# Patient Record
Sex: Female | Born: 1995 | Hispanic: Refuse to answer | Marital: Single | Smoking: Never smoker
Health system: Southern US, Community
[De-identification: ages and names within clinical notes are randomized; demographics above are authoritative.]

## PROBLEM LIST (undated history)

## (undated) DIAGNOSIS — N049 Nephrotic syndrome with unspecified morphologic changes: Secondary | ICD-10-CM

---
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
## 2016-11-03 ENCOUNTER — Ambulatory Visit (HOSPITAL_COMMUNITY)
Admission: EM | Admit: 2016-11-03 | Discharge: 2016-11-03 | Disposition: A | Discharge status: Home / Self Care | Payer: 59 | Attending: Internal Medicine | Admitting: Internal Medicine

## 2016-11-03 ENCOUNTER — Encounter (HOSPITAL_COMMUNITY): Payer: Self-pay | Admitting: *Deleted

## 2016-11-03 DIAGNOSIS — K529 Noninfective gastroenteritis and colitis, unspecified: Secondary | ICD-10-CM | POA: Diagnosis not present

## 2016-11-03 DIAGNOSIS — R197 Diarrhea, unspecified: Secondary | ICD-10-CM | POA: Diagnosis not present

## 2016-11-03 DIAGNOSIS — J00 Acute nasopharyngitis [common cold]: Secondary | ICD-10-CM | POA: Diagnosis not present

## 2016-11-03 DIAGNOSIS — R112 Nausea with vomiting, unspecified: Secondary | ICD-10-CM | POA: Diagnosis not present

## 2016-11-03 HISTORY — DX: Nephrotic syndrome with unspecified morphologic changes: N04.9

## 2016-11-03 MED ORDER — ONDANSETRON HCL 4 MG PO TABS: 4.0000 mg | ORAL_TABLET | Freq: Four times a day (QID) | ORAL | 0 refills | Status: DC

## 2016-11-03 MED ORDER — ONDANSETRON 4 MG PO TBDP
ORAL_TABLET | ORAL | Status: AC
  Filled 2016-11-03: qty 1

## 2016-11-03 MED ORDER — ONDANSETRON 4 MG PO TBDP
4.0000 mg | ORAL_TABLET | Freq: Once | ORAL | Status: AC
  Administered 2016-11-03: 4 mg via ORAL

## 2016-11-03 NOTE — Discharge Instructions (Signed)
You have some drainage going down the back of your throat causing discomfort and cough. Recommend taking Zyrtec or Allegra daily for that. For nausea and vomiting take the Zofran as directed. You have been given a dose of that in the urgent care prior to discharge. No solid foods for the next 24 hours. Drink primarily clear liquids in small frequent amounts. Slowly increase the volume as your stomach tolerates. Be sure to add Pedialyte to replace the electrolytes lost from vomiting and diarrhea. No milk or dairy products until all of your stomach problems have gone away. Tomorrow afternoon you can start with solid foods such as crackers, toast, bananas and rice  little bit at a time. Since your diarrhea has decreased do not take anything for it. It should stop on its own in the next 24-48 hours. Get plenty of rest and do not increase your activity over the next 3-4 days. He may take Tylenol for fever and discomfort but stay away from ibuprofen or Aleve.

## 2016-11-03 NOTE — ED Provider Notes (Signed)
CSN: [id number]     Arrival date & time 11/03/16  1407 History   First MD Initiated Contact with Patient 11/03/16 1658     Chief Complaint  Patient presents with  . Cough  . Emesis   (Consider location/radiation/quality/duration/timing/severity/associated sxs/prior Treatment) 21 year old female complaining of cough, body aches that have improved over the past day or 2, fevers that have been 102 at home, nausea, vomiting and diarrhea. Also with runny nose and PND. Denies sore throat or earache. Has taken NyQuil once for symptoms. Current temperature 100.7 pulse 101. Her mother is present.      Past Medical History:  Diagnosis Date  . Nephrotic syndrome    History reviewed. No pertinent surgical history. No family history on file. Social History  Substance Use Topics  . Smoking status: Never Smoker  . Smokeless tobacco: Never Used  . Alcohol use No   OB History    No data available     Review of Systems  Constitutional: Positive for activity change, appetite change and fever.  HENT: Positive for congestion, postnasal drip and rhinorrhea.   Respiratory: Positive for cough. Negative for shortness of breath.   Cardiovascular: Negative for chest pain.  Gastrointestinal: Positive for abdominal pain, diarrhea, nausea and vomiting.  Genitourinary: Negative.   Musculoskeletal: Negative.   Skin: Negative.   Neurological: Negative.   All other systems reviewed and are negative.   Allergies  Patient has no known allergies.  Home Medications   Prior to Admission medications   Medication Sig Start Date End Date Taking? Authorizing Provider  ondansetron (ZOFRAN) 4 MG tablet Take 1 tablet (4 mg total) by mouth every 6 (six) hours. 11/03/16   Hayden Rasmussen, NP   Meds Ordered and Administered this Visit   Medications  ondansetron (ZOFRAN-ODT) disintegrating tablet 4 mg (not administered)    BP 109/69   Pulse 101   Temp 100.7 F (38.2 C) (Oral)   Resp 20   LMP 10/20/2016  (Approximate)   SpO2 98%  No data found.   Physical Exam  Constitutional: She is oriented to person, place, and time. She appears well-developed and well-nourished. No distress.  HENT:  Head: Normocephalic and atraumatic.  Right Ear: External ear normal.  Left Ear: External ear normal.  Mouth/Throat: No oropharyngeal exudate.  Right TM normal. Left TM obscured by cerumen. Oropharynx with minor streaky erythema. No exudates or swelling.  Eyes: EOM are normal. Pupils are equal, round, and reactive to light.  Neck: Normal range of motion. Neck supple.  Cardiovascular: Normal rate, regular rhythm, normal heart sounds and intact distal pulses.   Pulmonary/Chest: Effort normal and breath sounds normal. No respiratory distress. She has no wheezes. She has no rales.  Abdominal: Soft. Bowel sounds are normal. She exhibits no distension. There is no tenderness. There is no rebound. No hernia.  Patient points to the bilateral most aspect of the abdomen as the source of discomfort. Currently no tenderness or pain. Abdomen is flat and nontender.  Musculoskeletal: Normal range of motion. She exhibits no edema.  Lymphadenopathy:    She has no cervical adenopathy.  Neurological: She is alert and oriented to person, place, and time.  Skin: Skin is warm and dry.  Psychiatric: She has a normal mood and affect.  Nursing note and vitals reviewed.   Urgent Care Course     Procedures (including critical care time)  Labs Review Labs Reviewed - No data to display  Imaging Review No results found.   Visual Acuity Review  Right Eye Distance:   Left Eye Distance:   Bilateral Distance:    Right Eye Near:   Left Eye Near:    Bilateral Near:         MDM   1. Acute nasopharyngitis   2. Gastroenteritis   3. Nausea vomiting and diarrhea    You have some drainage going down the back of your throat causing discomfort and cough. Recommend taking Zyrtec or Allegra daily for that. For nausea and  vomiting take the Zofran as directed. You have been given a dose of that in the urgent care prior to discharge. No solid foods for the next 24 hours. Drink primarily clear liquids in small frequent amounts. Slowly increase the volume as your stomach tolerates. Be sure to add Pedialyte to replace the electrolytes lost from vomiting and diarrhea. No milk or dairy products until all of your stomach problems have gone away. Tomorrow afternoon you can start with solid foods such as crackers, toast, bananas and rice  little bit at a time. Since your diarrhea has decreased do not take anything for it. It should stop on its own in the next 24-48 hours. Get plenty of rest and do not increase your activity over the next 3-4 days. He may take Tylenol for fever and discomfort but stay away from ibuprofen or Aleve. Meds ordered this encounter  Medications  . ondansetron (ZOFRAN) 4 MG tablet    Sig: Take 1 tablet (4 mg total) by mouth every 6 (six) hours.    Dispense:  12 tablet    Refill:  0    Order Specific Question:   Supervising Provider    Answer:   Eustace MooreMURRAY, LAURA W [[id number]]  . ondansetron (ZOFRAN-ODT) disintegrating tablet 4 mg       Hayden Rasmussenavid Evyn Kooyman, NP 11/03/16 1725

## 2016-11-03 NOTE — ED Triage Notes (Signed)
C/O starting with cough, body aches, nausea and body aches 3 days ago.  Started vomiting 2 days ago.  Able to keep down sips water and ginger ale, nothing else.  Fevers up to 102 today at home.  Took Nyquil - none today.

## 2017-01-03 ENCOUNTER — Encounter (HOSPITAL_COMMUNITY): Payer: Self-pay | Admitting: Emergency Medicine

## 2017-01-03 ENCOUNTER — Ambulatory Visit (HOSPITAL_COMMUNITY)
Admission: EM | Admit: 2017-01-03 | Discharge: 2017-01-03 | Disposition: A | Discharge status: Home / Self Care | Payer: 59 | Attending: Family Medicine | Admitting: Family Medicine

## 2017-01-03 DIAGNOSIS — R112 Nausea with vomiting, unspecified: Secondary | ICD-10-CM | POA: Diagnosis not present

## 2017-01-03 DIAGNOSIS — R197 Diarrhea, unspecified: Secondary | ICD-10-CM | POA: Diagnosis not present

## 2017-01-03 MED ORDER — ONDANSETRON HCL 4 MG PO TABS: 4.0000 mg | ORAL_TABLET | Freq: Four times a day (QID) | ORAL | 0 refills | Status: DC

## 2017-01-03 NOTE — ED Triage Notes (Signed)
Abdominal pain vomiting.  Reports symptoms started yesterday.  Patient had one episode of diarrhea.

## 2017-01-03 NOTE — ED Provider Notes (Signed)
[facility]    CSN: [id number] Arrival date & time: 01/03/17  1834     History   Chief Complaint Chief Complaint  Patient presents with  . Abdominal Pain    HPI Tami Robinson is a 21 y.o. female.   Abdominal pain vomiting.  Reports symptoms started yesterday.  Patient had one episode of diarrhea.    Patient works at [employer] in [place]. She has a roommate who is been in good health. Her symptoms began shortly after she had some food at work with vomiting and nausea. When she woke up this morning, she vomited some more and had some diarrhea. She's not had any vomiting since this morning and she's been able to keep fluids down.  She has no intense abdominal pain but rather has some queasiness in the midabdomen.      Past Medical History:  Diagnosis Date  . Nephrotic syndrome     There are no active problems to display for this patient.   History reviewed. No pertinent surgical history.  OB History    No data available       Home Medications    Prior to Admission medications   Medication Sig Start Date End Date Taking? Authorizing Provider  ondansetron (ZOFRAN) 4 MG tablet Take 1 tablet (4 mg total) by mouth every 6 (six) hours. 01/03/17   Elvina Sidle, MD    Family History No family history on file.  Social History Social History  Substance Use Topics  . Smoking status: Never Smoker  . Smokeless tobacco: Never Used  . Alcohol use No     Allergies   Patient has no known allergies.   Review of Systems Review of Systems  Constitutional: Positive for appetite change.  HENT: Negative.   Respiratory: Negative.   Gastrointestinal: Positive for abdominal pain, diarrhea and vomiting.  Genitourinary: Negative.   Neurological: Negative.      Physical Exam Triage Vital Signs ED Triage Vitals [01/03/17 1857]  Enc Vitals Group     BP 121/73     Pulse Rate 61     Resp 14     Temp 98.3 F (36.8 C)     Temp Source Oral   SpO2 100 %     Weight      Height      Head Circumference      Peak Flow      Pain Score 5     Pain Loc      Pain Edu?      Excl. in GC?    No data found.   Updated Vital Signs BP 121/73 (BP Location: Left Arm)   Pulse 61   Temp 98.3 F (36.8 C) (Oral)   Resp 14   SpO2 100%    Physical Exam  Constitutional: She is oriented to person, place, and time. She appears well-developed and well-nourished.  HENT:  Head: Normocephalic.  Right Ear: External ear normal.  Left Ear: External ear normal.  Mouth/Throat: Oropharynx is clear and moist.  Eyes: Conjunctivae are normal. Pupils are equal, round, and reactive to light.  Neck: Normal range of motion. Neck supple.  Cardiovascular: Normal rate, regular rhythm and normal heart sounds.   Pulmonary/Chest: Effort normal and breath sounds normal.  Abdominal: Soft. She exhibits no distension. There is no tenderness. There is no guarding.  Musculoskeletal: Normal range of motion.  Neurological: She is alert and oriented to person, place, and time.  Skin: Skin is warm and dry.  Nursing note and vitals reviewed.    UC Treatments / Results  Labs (all labs ordered are listed, but only abnormal results are displayed) Labs Reviewed - No data to display  EKG  EKG Interpretation None       Radiology No results found.  Procedures Procedures (including critical care time)  Medications Ordered in UC Medications - No data to display   Initial Impression / Assessment and Plan / UC Course  I have reviewed the triage vital signs and the nursing notes.  Pertinent labs & imaging results that were available during my care of the patient were reviewed by me and considered in my medical decision making (see chart for details).     Final Clinical Impressions(s) / UC Diagnoses   Final diagnoses:  Nausea vomiting and diarrhea    New Prescriptions Current Discharge Medication List       Elvina Sidle, MD 01/03/17 [phone]

## 2017-02-01 ENCOUNTER — Encounter (HOSPITAL_COMMUNITY): Payer: Self-pay | Admitting: Emergency Medicine

## 2017-02-01 ENCOUNTER — Ambulatory Visit (HOSPITAL_COMMUNITY)
Admission: EM | Admit: 2017-02-01 | Discharge: 2017-02-01 | Disposition: A | Discharge status: Home / Self Care | Payer: 59 | Attending: Internal Medicine | Admitting: Internal Medicine

## 2017-02-01 DIAGNOSIS — R197 Diarrhea, unspecified: Secondary | ICD-10-CM

## 2017-02-01 DIAGNOSIS — R109 Unspecified abdominal pain: Secondary | ICD-10-CM

## 2017-02-01 DIAGNOSIS — R112 Nausea with vomiting, unspecified: Secondary | ICD-10-CM | POA: Diagnosis not present

## 2017-02-01 DIAGNOSIS — R1115 Cyclical vomiting syndrome unrelated to migraine: Secondary | ICD-10-CM

## 2017-02-01 DIAGNOSIS — G43A0 Cyclical vomiting, not intractable: Secondary | ICD-10-CM | POA: Diagnosis not present

## 2017-02-01 MED ORDER — ONDANSETRON HCL 4 MG PO TABS: 4.0000 mg | ORAL_TABLET | Freq: Four times a day (QID) | ORAL | 0 refills | Status: DC

## 2017-02-01 NOTE — ED Provider Notes (Signed)
CSN: [id number]     Arrival date & time 02/01/17  1057 History   First MD Initiated Contact with Patient 02/01/17 1304     Chief Complaint  Patient presents with  . Abdominal Cramping   (Consider location/radiation/quality/duration/timing/severity/associated sxs/prior Treatment) 21 yo presents with a <48 history of nausea, some mild diarrhea and emesis. Actually improved today. She notes that she had seafood 3 days ago and does not know if related. She had some abdominal cramping, but this has subsided. She denies bloody diarrhea or fever. See remainder of ROS. She is requesting a work note.       Past Medical History:  Diagnosis Date  . Nephrotic syndrome    History reviewed. No pertinent surgical history. History reviewed. No pertinent family history. Social History  Substance Use Topics  . Smoking status: Never Smoker  . Smokeless tobacco: Never Used  . Alcohol use No   OB History    No data available     Review of Systems  Constitutional: Negative for chills and fever.  Gastrointestinal: Positive for diarrhea, nausea and vomiting. Negative for abdominal distention, abdominal pain, blood in stool and constipation.  Genitourinary: Negative.   Skin: Negative.     Allergies  Patient has no known allergies.  Home Medications   Prior to Admission medications   Medication Sig Start Date End Date Taking? Authorizing Provider  ondansetron (ZOFRAN) 4 MG tablet Take 1 tablet (4 mg total) by mouth every 6 (six) hours. 02/01/17   Riki SheerYoung, Peony Barner G, PA-C   Meds Ordered and Administered this Visit  Medications - No data to display  BP (!) 103/57 (BP Location: Right Arm)   Pulse 65   Temp 98.3 F (36.8 C) (Oral)   Resp 16   SpO2 99%  No data found.   Physical Exam  Constitutional: She is oriented to person, place, and time. She appears well-developed and well-nourished. No distress.  HENT:  Head: Normocephalic and atraumatic.  Abdominal: Soft. Bowel sounds are normal.  She exhibits no distension and no mass. There is no tenderness. There is no rebound and no guarding.  Neurological: She is alert and oriented to person, place, and time.  Skin: Skin is warm and dry. She is not diaphoretic.  Psychiatric: Her behavior is normal.  Nursing note and vitals reviewed.   Urgent Care Course     Procedures (including critical care time)  Labs Review Labs Reviewed - No data to display  Imaging Review No results found.   Visual Acuity Review  Right Eye Distance:   Left Eye Distance:   Bilateral Distance:    Right Eye Near:   Left Eye Near:    Bilateral Near:         MDM   1. Non-intractable cyclical vomiting with nausea    Improving with normal Abdominal exam today. Will give RX for Zofran if needed. Work note is given as requested. She is hopefully improving. Continue with bland diet and liquids.  Happy to see her back as needed.     Riki SheerYoung, Rinda Rollyson G, PA-C 02/01/17 1329

## 2017-02-01 NOTE — ED Triage Notes (Signed)
[facility] a [facility] of abdominal cramping with N/V after eating seafood 3 days ago.

## 2017-02-01 NOTE — Discharge Instructions (Signed)
Hopefully you are on the mend. May take zofran as needed for nausea. Be kind to your GI tract and eat really bland foods. No high fat. Rest and fluids. If worsens follow up. Feel better.

## 2017-05-31 ENCOUNTER — Ambulatory Visit (INDEPENDENT_AMBULATORY_CARE_PROVIDER_SITE_OTHER): Payer: 59 | Admitting: Urgent Care

## 2017-05-31 ENCOUNTER — Encounter: Payer: Self-pay | Admitting: Urgent Care

## 2017-05-31 VITALS — BP 92/51 | HR 71 | Temp 98.1°F | Resp 17 | Ht 62.5 in | Wt 106.0 lb

## 2017-05-31 DIAGNOSIS — R112 Nausea with vomiting, unspecified: Secondary | ICD-10-CM

## 2017-05-31 DIAGNOSIS — R1084 Generalized abdominal pain: Secondary | ICD-10-CM | POA: Diagnosis not present

## 2017-05-31 LAB — POCT URINALYSIS DIP (MANUAL ENTRY)
BILIRUBIN UA: NEGATIVE
Glucose, UA: NEGATIVE mg/dL
Ketones, POC UA: NEGATIVE mg/dL
Leukocytes, UA: NEGATIVE
NITRITE UA: NEGATIVE
PH UA: 6 (ref 5.0–8.0)
Protein Ur, POC: 30 mg/dL — AB
Spec Grav, UA: 1.025 (ref 1.010–1.025)
Urobilinogen, UA: 0.2 E.U./dL

## 2017-05-31 LAB — POCT URINE PREGNANCY: Preg Test, Ur: NEGATIVE

## 2017-05-31 MED ORDER — ONDANSETRON 8 MG PO TBDP: 8.0000 mg | ORAL_TABLET | Freq: Three times a day (TID) | ORAL | 0 refills | Status: DC | PRN

## 2017-05-31 NOTE — Progress Notes (Signed)
  MRN: [id number] DOB: 01/16/1996  Subjective:   Axel FillerDevyn M Boydstun is a 21 y.o. female presenting for chief complaint of Abdominal Pain and Emesis  Reports 1 day history of nausea with vomiting (2 episodes) that started today, had subsequent belly pain. She ate seafood last night. She was able to eat soup today. Has not tried any medications for relief. Denies fever, bloody stools, diarrhea, loose stools, hematemesis, hematuria, dysuria. Patient is currently on her menstrual cycle.   Denijah is not currently taking any medications. Also has No Known Allergies.  Maddilynn  has a past medical history of Nephrotic syndrome. Denies past surgical history.   Objective:   Vitals: BP (!) 92/51   Pulse 71   Temp 98.1 F (36.7 C) (Oral)   Resp 17   Ht 5' 2.5" (1.588 m)   Wt 106 lb (48.1 kg)   LMP 05/31/2017 (Approximate)   SpO2 98%   BMI 19.08 kg/m   Physical Exam  Constitutional: She is oriented to person, place, and time. She appears well-developed and well-nourished.  HENT:  Lips are dry, chapped.  Cardiovascular: Normal rate, regular rhythm and intact distal pulses.  Exam reveals no gallop and no friction rub.   No murmur heard. Pulmonary/Chest: No respiratory distress. She has no wheezes. She has no rales.  Abdominal: Soft. Bowel sounds are normal. She exhibits no distension and no mass. There is tenderness (diffuse, upper abdomen). There is no guarding.  Neurological: She is alert and oriented to person, place, and time.  Skin: Skin is warm and dry.   Results for orders placed or performed in visit on 05/31/17 (from the past 24 hour(s))  POCT urinalysis dipstick     Status: Abnormal   Collection Time: 05/31/17  1:55 PM  Result Value Ref Range   Color, UA yellow yellow   Clarity, UA clear clear   Glucose, UA negative negative mg/dL   Bilirubin, UA negative negative   Ketones, POC UA negative negative mg/dL   Spec Grav, UA [id number] 1.1911.010 - 1.025   Blood, UA trace-lysed (A) negative   pH, UA  6.0 5.0 - 8.0   Protein Ur, POC =30 (A) negative mg/dL   Urobilinogen, UA 0.2 0.2 or 1.0 E.U./dL   Nitrite, UA Negative Negative   Leukocytes, UA Negative Negative  POCT urine pregnancy     Status: None   Collection Time: 05/31/17  1:56 PM  Result Value Ref Range   Preg Test, Ur Negative Negative   Assessment and Plan :   1. Nausea and vomiting, intractability of vomiting not specified, unspecified vomiting type 2. Generalized abdominal pain - Will manage as a viral gastroenteritis. Hydrate aggressively, use Zofran for nausea with vomiting. Return-to-clinic precautions discussed, patient verbalized understanding.   Wallis BambergMario Sheilyn Boehlke, PA-C [facility] [phone] 05/31/2017  1:45 PM

## 2017-05-31 NOTE — Patient Instructions (Addendum)
Viral Gastroenteritis, Adult Viral gastroenteritis is also known as the stomach flu. This condition is caused by various viruses. These viruses can be passed from person to person very easily (are very contagious). This condition may affect your stomach, small intestine, and large intestine. It can cause sudden watery diarrhea, fever, and vomiting. Diarrhea and vomiting can make you feel weak and cause you to become dehydrated. You may not be able to keep fluids down. Dehydration can make you tired and thirsty, cause you to have a dry mouth, and decrease how often you urinate. Older adults and people with other diseases or a weak immune system are at higher risk for dehydration. It is important to replace the fluids that you lose from diarrhea and vomiting. If you become severely dehydrated, you may need to get fluids through an IV tube. What are the causes? Gastroenteritis is caused by various viruses, including rotavirus and norovirus. Norovirus is the most common cause in adults. You can get sick by eating food, drinking water, or touching a surface contaminated with one of these viruses. You can also get sick from sharing utensils or other personal items with an infected person. What increases the risk? This condition is more likely to develop in people:  Who have a weak defense system (immune system).  Who live with one or more children who are younger than 2 years old.  Who live in a nursing home.  Who go on cruise ships.  What are the signs or symptoms? Symptoms of this condition start suddenly 1-2 days after exposure to a virus. Symptoms may last a few days or as long as a week. The most common symptoms are watery diarrhea and vomiting. Other symptoms include:  Fever.  Headache.  Fatigue.  Pain in the abdomen.  Chills.  Weakness.  Nausea.  Muscle aches.  Loss of appetite.  How is this diagnosed? This condition is diagnosed with a medical history and physical exam. You  may also have a stool test to check for viruses or other infections. How is this treated? This condition typically goes away on its own. The focus of treatment is to restore lost fluids (rehydration). Your health care provider may recommend that you take an oral rehydration solution (ORS) to replace important salts and minerals (electrolytes) in your body. Severe cases of this condition may require giving fluids through an IV tube. Treatment may also include medicine to help with your symptoms. Follow these instructions at home: Follow instructions from your health care provider about how to care for yourself at home. Eating and drinking Follow these recommendations as told by your health care provider:  Take an ORS. This is a drink that is sold at pharmacies and retail stores.  Drink clear fluids in small amounts as you are able. Clear fluids include water, ice chips, diluted fruit juice, and low-calorie sports drinks.  Eat bland, easy-to-digest foods in small amounts as you are able. These foods include bananas, applesauce, rice, lean meats, toast, and crackers.  Avoid fluids that contain a lot of sugar or caffeine, such as energy drinks, sports drinks, and soda.  Avoid alcohol.  Avoid spicy or fatty foods.  General instructions   Drink enough fluid to keep your urine clear or pale yellow.  Wash your hands often. If soap and water are not available, use hand sanitizer.  Make sure that all people in your household wash their hands well and often.  Take over-the-counter and prescription medicines only as told by your health   care provider.  Rest at home while you recover.  Watch your condition for any changes.  Take a warm bath to relieve any burning or pain from frequent diarrhea episodes.  Keep all follow-up visits as told by your health care provider. This is important. Contact a health care provider if:  You cannot keep fluids down.  Your symptoms get worse.  You have  new symptoms.  You feel light-headed or dizzy.  You have muscle cramps. Get help right away if:  You have chest pain.  You feel extremely weak or you faint.  You see blood in your vomit.  Your vomit looks like coffee grounds.  You have bloody or black stools or stools that look like tar.  You have a severe headache, a stiff neck, or both.  You have a rash.  You have severe pain, cramping, or bloating in your abdomen.  You have trouble breathing or you are breathing very quickly.  Your heart is beating very quickly.  Your skin feels cold and clammy.  You feel confused.  You have pain when you urinate.  You have signs of dehydration, such as: ? Dark urine, very little urine, or no urine. ? Cracked lips. ? Dry mouth. ? Sunken eyes. ? Sleepiness. ? Weakness. This information is not intended to replace advice given to you by your health care provider. Make sure you discuss any questions you have with your health care provider. Document Released: 09/10/2005 Document Revised: 02/22/2016 Document Reviewed: 05/17/2015 Elsevier Interactive Patient Education  2017 [employer]     IF you received an x-ray today, you will receive an invoice from [facility]. Please contact [facility] at [phone] with questions or concerns regarding your invoice.   IF you received labwork today, you will receive an invoice from [facility]. Please contact [facility] at [phone] with questions or concerns regarding your invoice.   Our billing staff will not be able to assist you with questions regarding bills from these companies.  You will be contacted with the lab results as soon as they are available. The fastest way to get your results is to activate your My Chart account. Instructions are located on the last page of this paperwork. If you have not heard from us regarding the results in 2 weeks, please contact this office.

## 2017-06-10 ENCOUNTER — Ambulatory Visit (INDEPENDENT_AMBULATORY_CARE_PROVIDER_SITE_OTHER): Payer: 59 | Admitting: Physician Assistant

## 2017-06-10 ENCOUNTER — Encounter: Payer: Self-pay | Admitting: Physician Assistant

## 2017-06-10 VITALS — BP 107/74 | HR 76 | Temp 98.1°F | Resp 16 | Ht 62.5 in | Wt 103.0 lb

## 2017-06-10 DIAGNOSIS — Z113 Encounter for screening for infections with a predominantly sexual mode of transmission: Secondary | ICD-10-CM | POA: Diagnosis not present

## 2017-06-10 NOTE — Progress Notes (Signed)
PRIMARY CARE AT [facility] [address], [place] [address] [phone]  Date:  06/10/2017   Name:  TALULLAH ABATE   DOB:  1996/04/02   MRN:  [id number]  PCP:  Patient, No Pcp Per    History of Present Illness:  Jamilla MEG NIEMEIER is a 21 y.o. female patient who presents to PCP with  Chief Complaint  Patient presents with  . std check    no symptoms    She is with one partner   Concerned of partner that is having spotting.  She has had no symptoms.  No vaginal bleeding, abnormal discharge, dysuria,, hematuria, frequency, abdominal pian, or rash.  No feelings of malaise.    There are no active problems to display for this patient.   Past Medical History:  Diagnosis Date  . Nephrotic syndrome     No past surgical history on file.  Social History  Substance Use Topics  . Smoking status: Never Smoker  . Smokeless tobacco: Never Used  . Alcohol use No    No family history on file.  No Known Allergies  Medication list has been reviewed and updated.  Current Outpatient Prescriptions on File Prior to Visit  Medication Sig Dispense Refill  . ondansetron (ZOFRAN-ODT) 8 MG disintegrating tablet Take 1 tablet (8 mg total) by mouth every 8 (eight) hours as needed for nausea. (Patient not taking: Reported on 06/10/2017) 15 tablet 0   No current facility-administered medications on file prior to visit.     ROS ROS otherwise unremarkable unless listed above.  Physical Examination: BP 107/74   Pulse 76   Temp 98.1 F (36.7 C) (Oral)   Resp 16   Ht 5' 2.5" (1.588 m)   Wt 103 lb (46.7 kg)   LMP 05/31/2017 (Approximate)   SpO2 99%   BMI 18.54 kg/m  Ideal Body Weight: Weight in (lb) to have BMI = 25: 138.6  Physical Exam  Constitutional: She is oriented to person, place, and time. She appears well-developed and well-nourished. No distress.  HENT:  Head: Normocephalic and atraumatic.  Right Ear: External ear normal.  Left Ear: External ear normal.  Eyes: Pupils are  equal, round, and reactive to light. Conjunctivae and EOM are normal.  Cardiovascular: Normal rate.   Pulmonary/Chest: Effort normal. No respiratory distress.  Neurological: She is alert and oriented to person, place, and time.  Skin: She is not diaphoretic.  Psychiatric: She has a normal mood and affect. Her behavior is normal.     Assessment and Plan: Camri VAUGHN FRIEZE is a 21 y.o. female who is here today for today with  Chief Complaint  Patient presents with  . std check    no symptoms   Screening for STD (sexually transmitted disease) - Plan: HIV antibody, RPR, GC/Chlamydia Probe Amp  Trena Platt, PA-C [facility] 9/20/20188:34 AM

## 2017-06-10 NOTE — Patient Instructions (Signed)
     IF you received an x-ray today, you will receive an invoice from [facility]. Please contact  Radiology at [phone] with questions or concerns regarding your invoice.   IF you received labwork today, you will receive an invoice from [facility]. Please contact [facility] at [phone] with questions or concerns regarding your invoice.   Our billing staff will not be able to assist you with questions regarding bills from these companies.  You will be contacted with the lab results as soon as they are available. The fastest way to get your results is to activate your My Chart account. Instructions are located on the last page of this paperwork. If you have not heard from us regarding the results in 2 weeks, please contact this office.     

## 2017-06-11 LAB — GC/CHLAMYDIA PROBE AMP
Chlamydia trachomatis, NAA: NEGATIVE
Neisseria gonorrhoeae by PCR: NEGATIVE

## 2017-06-11 LAB — HIV ANTIBODY (ROUTINE TESTING W REFLEX): HIV SCREEN 4TH GENERATION: NONREACTIVE

## 2017-06-11 LAB — RPR: RPR: NONREACTIVE

## 2017-07-29 ENCOUNTER — Encounter: Payer: 59 | Admitting: Physician Assistant

## 2017-08-10 ENCOUNTER — Encounter: Payer: Self-pay | Admitting: Physician Assistant

## 2017-08-30 ENCOUNTER — Telehealth: Payer: Self-pay | Admitting: Physician Assistant

## 2017-08-30 NOTE — Telephone Encounter (Signed)
I tried to call pt. To reschedule her appt. From Monday 09/02/17 to 08/31/17 or a different day due to the upcoming snow.  If pt. calls back, please try to reschedule her for tomorrow 08/31/17 or a different day.   Thank you!

## 2017-09-02 ENCOUNTER — Encounter: Payer: 59 | Admitting: Physician Assistant

## 2017-09-06 ENCOUNTER — Encounter: Payer: 59 | Admitting: Urgent Care

## 2017-09-10 ENCOUNTER — Ambulatory Visit (INDEPENDENT_AMBULATORY_CARE_PROVIDER_SITE_OTHER): Payer: 59 | Admitting: Urgent Care

## 2017-09-10 ENCOUNTER — Encounter: Payer: Self-pay | Admitting: Urgent Care

## 2017-09-10 VITALS — BP 100/80 | HR 67 | Temp 98.9°F | Resp 16 | Ht 61.0 in | Wt 103.2 lb

## 2017-09-10 DIAGNOSIS — Z23 Encounter for immunization: Secondary | ICD-10-CM | POA: Diagnosis not present

## 2017-09-10 DIAGNOSIS — Z1159 Encounter for screening for other viral diseases: Secondary | ICD-10-CM | POA: Diagnosis not present

## 2017-09-10 DIAGNOSIS — Z113 Encounter for screening for infections with a predominantly sexual mode of transmission: Secondary | ICD-10-CM

## 2017-09-10 DIAGNOSIS — Z0000 Encounter for general adult medical examination without abnormal findings: Secondary | ICD-10-CM

## 2017-09-10 DIAGNOSIS — Z7251 High risk heterosexual behavior: Secondary | ICD-10-CM

## 2017-09-10 NOTE — Patient Instructions (Addendum)
Health Maintenance, Female Adopting a healthy lifestyle and getting preventive care can go a long way to promote health and wellness. Talk with your health care provider about what schedule of regular examinations is right for you. This is a good chance for you to check in with your provider about disease prevention and staying healthy. In between checkups, there are plenty of things you can do on your own. Experts have done a lot of research about which lifestyle changes and preventive measures are most likely to keep you healthy. Ask your health care provider for more information. Weight and diet Eat a healthy diet  Be sure to include plenty of vegetables, fruits, low-fat dairy products, and lean protein.  Do not eat a lot of foods high in solid fats, added sugars, or salt.  Get regular exercise. This is one of the most important things you can do for your health. ? Most adults should exercise for at least 150 minutes each week. The exercise should increase your heart rate and make you sweat (moderate-intensity exercise). ? Most adults should also do strengthening exercises at least twice a week. This is in addition to the moderate-intensity exercise.  Maintain a healthy weight  Body mass index (BMI) is a measurement that can be used to identify possible weight problems. It estimates body fat based on height and weight. Your health care provider can help determine your BMI and help you achieve or maintain a healthy weight.  For females 20 years of age and older: ? A BMI below 18.5 is considered underweight. ? A BMI of 18.5 to 24.9 is normal. ? A BMI of 25 to 29.9 is considered overweight. ? A BMI of 30 and above is considered obese.  Watch levels of cholesterol and blood lipids  You should start having your blood tested for lipids and cholesterol at 20 years of age, then have this test every 5 years.  You may need to have your cholesterol levels checked more often if: ? Your lipid or  cholesterol levels are high. ? You are older than 21 years of age. ? You are at high risk for heart disease.  Cancer screening Lung Cancer  Lung cancer screening is recommended for adults 55-80 years old who are at high risk for lung cancer because of a history of smoking.  A yearly low-dose CT scan of the lungs is recommended for people who: ? Currently smoke. ? Have quit within the past 15 years. ? Have at least a 30-pack-year history of smoking. A pack year is smoking an average of one pack of cigarettes a day for 1 year.  Yearly screening should continue until it has been 15 years since you quit.  Yearly screening should stop if you develop a health problem that would prevent you from having lung cancer treatment.  Breast Cancer  Practice breast self-awareness. This means understanding how your breasts normally appear and feel.  It also means doing regular breast self-exams. Let your health care provider know about any changes, no matter how small.  If you are in your 20s or 30s, you should have a clinical breast exam (CBE) by a health care provider every 1-3 years as part of a regular health exam.  If you are 40 or older, have a CBE every year. Also consider having a breast X-ray (mammogram) every year.  If you have a family history of breast cancer, talk to your health care provider about genetic screening.  If you are at high risk   for breast cancer, talk to your health care provider about having an MRI and a mammogram every year.  Breast cancer gene (BRCA) assessment is recommended for women who have family members with BRCA-related cancers. BRCA-related cancers include: ? Breast. ? Ovarian. ? Tubal. ? Peritoneal cancers.  Results of the assessment will determine the need for genetic counseling and BRCA1 and BRCA2 testing.  Cervical Cancer Your health care provider may recommend that you be screened regularly for cancer of the pelvic organs (ovaries, uterus, and  vagina). This screening involves a pelvic examination, including checking for microscopic changes to the surface of your cervix (Pap test). You may be encouraged to have this screening done every 3 years, beginning at age 22.  For women ages 56-65, health care providers may recommend pelvic exams and Pap testing every 3 years, or they may recommend the Pap and pelvic exam, combined with testing for human papilloma virus (HPV), every 5 years. Some types of HPV increase your risk of cervical cancer. Testing for HPV may also be done on women of any age with unclear Pap test results.  Other health care providers may not recommend any screening for nonpregnant women who are considered low risk for pelvic cancer and who do not have symptoms. Ask your health care provider if a screening pelvic exam is right for you.  If you have had past treatment for cervical cancer or a condition that could lead to cancer, you need Pap tests and screening for cancer for at least 20 years after your treatment. If Pap tests have been discontinued, your risk factors (such as having a new sexual partner) need to be reassessed to determine if screening should resume. Some women have medical problems that increase the chance of getting cervical cancer. In these cases, your health care provider may recommend more frequent screening and Pap tests.  Colorectal Cancer  This type of cancer can be detected and often prevented.  Routine colorectal cancer screening usually begins at 21 years of age and continues through 21 years of age.  Your health care provider may recommend screening at an earlier age if you have risk factors for colon cancer.  Your health care provider may also recommend using home test kits to check for hidden blood in the stool.  A small camera at the end of a tube can be used to examine your colon directly (sigmoidoscopy or colonoscopy). This is done to check for the earliest forms of colorectal  cancer.  Routine screening usually begins at age 33.  Direct examination of the colon should be repeated every 5-10 years through 21 years of age. However, you may need to be screened more often if early forms of precancerous polyps or small growths are found.  Skin Cancer  Check your skin from head to toe regularly.  Tell your health care provider about any new moles or changes in moles, especially if there is a change in a mole's shape or color.  Also tell your health care provider if you have a mole that is larger than the size of a pencil eraser.  Always use sunscreen. Apply sunscreen liberally and repeatedly throughout the day.  Protect yourself by wearing long sleeves, pants, a wide-brimmed hat, and sunglasses whenever you are outside.  Heart disease, diabetes, and high blood pressure  High blood pressure causes heart disease and increases the risk of stroke. High blood pressure is more likely to develop in: ? People who have blood pressure in the high end of  the normal range (130-139/85-89 mm Hg). ? People who are overweight or obese. ? People who are African American.  If you are 21-29 years of age, have your blood pressure checked every 3-5 years. If you are 3 years of age or older, have your blood pressure checked every year. You should have your blood pressure measured twice-once when you are at a hospital or clinic, and once when you are not at a hospital or clinic. Record the average of the two measurements. To check your blood pressure when you are not at a hospital or clinic, you can use: ? An automated blood pressure machine at a pharmacy. ? A home blood pressure monitor.  If you are between 17 years and 37 years old, ask your health care provider if you should take aspirin to prevent strokes.  Have regular diabetes screenings. This involves taking a blood sample to check your fasting blood sugar level. ? If you are at a normal weight and have a low risk for diabetes,  have this test once every three years after 21 years of age. ? If you are overweight and have a high risk for diabetes, consider being tested at a younger age or more often. Preventing infection Hepatitis B  If you have a higher risk for hepatitis B, you should be screened for this virus. You are considered at high risk for hepatitis B if: ? You were born in a country where hepatitis B is common. Ask your health care provider which countries are considered high risk. ? Your parents were born in a high-risk country, and you have not been immunized against hepatitis B (hepatitis B vaccine). ? You have HIV or AIDS. ? You use needles to inject street drugs. ? You live with someone who has hepatitis B. ? You have had sex with someone who has hepatitis B. ? You get hemodialysis treatment. ? You take certain medicines for conditions, including cancer, organ transplantation, and autoimmune conditions.  Hepatitis C  Blood testing is recommended for: ? Everyone born from [birth year] through 1965. ? Anyone with known risk factors for hepatitis C.  Sexually transmitted infections (STIs)  You should be screened for sexually transmitted infections (STIs) including gonorrhea and chlamydia if: ? You are sexually active and are younger than 21 years of age. ? You are older than 21 years of age and your health care provider tells you that you are at risk for this type of infection. ? Your sexual activity has changed since you were last screened and you are at an increased risk for chlamydia or gonorrhea. Ask your health care provider if you are at risk.  If you do not have HIV, but are at risk, it may be recommended that you take a prescription medicine daily to prevent HIV infection. This is called pre-exposure prophylaxis (PrEP). You are considered at risk if: ? You are sexually active and do not regularly use condoms or know the HIV status of your partner(s). ? You take drugs by injection. ? You are  sexually active with a partner who has HIV.  Talk with your health care provider about whether you are at high risk of being infected with HIV. If you choose to begin PrEP, you should first be tested for HIV. You should then be tested every 3 months for as long as you are taking PrEP. Pregnancy  If you are premenopausal and you may become pregnant, ask your health care provider about preconception counseling.  If you may become  pregnant, take 400 to 800 micrograms (mcg) of folic acid every day.  If you want to prevent pregnancy, talk to your health care provider about birth control (contraception). Osteoporosis and menopause  Osteoporosis is a disease in which the bones lose minerals and strength with aging. This can result in serious bone fractures. Your risk for osteoporosis can be identified using a bone density scan.  If you are 37 years of age or older, or if you are at risk for osteoporosis and fractures, ask your health care provider if you should be screened.  Ask your health care provider whether you should take a calcium or vitamin D supplement to lower your risk for osteoporosis.  Menopause may have certain physical symptoms and risks.  Hormone replacement therapy may reduce some of these symptoms and risks. Talk to your health care provider about whether hormone replacement therapy is right for you. Follow these instructions at home:  Schedule regular health, dental, and eye exams.  Stay current with your immunizations.  Do not use any tobacco products including cigarettes, chewing tobacco, or electronic cigarettes.  If you are pregnant, do not drink alcohol.  If you are breastfeeding, limit how much and how often you drink alcohol.  Limit alcohol intake to no more than 1 drink per day for nonpregnant women. One drink equals 12 ounces of beer, 5 ounces of wine, or 1 ounces of hard liquor.  Do not use street drugs.  Do not share needles.  Ask your health care  provider for help if you need support or information about quitting drugs.  Tell your health care provider if you often feel depressed.  Tell your health care provider if you have ever been abused or do not feel safe at home. This information is not intended to replace advice given to you by your health care provider. Make sure you discuss any questions you have with your health care provider. Document Released: 03/26/2011 Document Revised: 02/16/2016 Document Reviewed: 06/14/2015 Elsevier Interactive Patient Education  2018 [place] Sex Practicing safe sex means taking steps before and during sex to reduce your risk of:  Getting an STD (sexually transmitted disease).  Giving your partner an STD.  Unwanted pregnancy.  How can I practice safe sex?  To practice safe sex:  Limit your sexual partners to only one partner who is having sex with only you.  Avoid using alcohol and recreational drugs before having sex. These substances can affect your judgment.  Before having sex with a new partner: ? Talk to your partner about past partners, past STDs, and drug use. ? You and your partner should be screened for STDs and discuss the results with each other.  Check your body regularly for sores, blisters, rashes, or unusual discharge. If you notice any of these problems, visit your health care provider.  If you have symptoms of an infection or you are being treated for an STD, avoid sexual contact.  While having sex, use a condom. Make sure to: ? Use a condom every time you have vaginal, oral, or anal sex. Both females and males should wear condoms during oral sex. ? Keep condoms in place from the beginning to the end of sexual activity. ? Use a latex condom, if possible. Latex condoms offer the best protection. ? Use only water-based lubricants or oils to lubricate a condom. Using petroleum-based lubricants or oils will weaken the condom and increase the chance that it  will break.  See your  health care provider for regular screenings, exams, and tests for STDs.  Talk with your health care provider about the form of birth control (contraception) that is best for you.  Get vaccinated against hepatitis B and human papillomavirus (HPV).  If you are at risk of being infected with HIV (human immunodeficiency virus), talk with your health care provider about taking a prescription medicine to prevent HIV infection. You are considered at risk for HIV if: ? You are a man who has sex with other men. ? You are a heterosexual man or woman who is sexually active with more than one partner. ? You take drugs by injection. ? You are sexually active with a partner who has HIV.  This information is not intended to replace advice given to you by your health care provider. Make sure you discuss any questions you have with your health care provider. Document Released: 10/18/2004 Document Revised: 01/25/2016 Document Reviewed: 07/31/2015 Elsevier Interactive Patient Education  2018 [employer].   IF you received an x-ray today, you will receive an invoice from [facility]. Please contact [facility] at [phone] with questions or concerns regarding your invoice.   IF you received labwork today, you will receive an invoice from [place]. Please contact [facility] at [phone] with questions or concerns regarding your invoice.   Our billing staff will not be able to assist you with questions regarding bills from these companies.  You will be contacted with the lab results as soon as they are available. The fastest way to get your results is to activate your My Chart account. Instructions are located on the last page of this paperwork. If you have not heard from [place] regarding the results in 2 weeks, please contact this office.

## 2017-09-10 NOTE — Progress Notes (Signed)
  MRN: [id number]  Subjective:   Ms. Tami Robinson is a 21 y.o. female presenting for annual physical exam. Is currently single, has sex with women only, does not use any protection. Works in [employer], Producer, television/film/videomanual labor, very fast paced job. Tries to hydrate but admits she should be drinking more water. Denies smoking cigarettes, has 1 glass of wine per week.   Medical care team includes: PCP: Patient, No Pcp Per Vision: She is supposed to wear glasses.  Dental: Cleanings once every six months. OB/GYN: Has never had a pap smear. Patient prefers to return to clinic for this. Specialists: None.   Tami Robinson currently has no medications in their medication list. She has No Known Allergies. Tami Robinson  has a past medical history of Nephrotic syndrome. Denies past surgical history. Denies family history of cancer, diabetes, HTN, HL, heart disease, stroke, mental illness.   Immunizations: Will update tdap. Refuses flu shot.  ROS  Objective:   Vitals: BP 100/80   Pulse 67   Temp 98.9 F (37.2 C) (Oral)   Resp 16   Ht 5\' 1"  (1.549 m)   Wt 103 lb 3.2 oz (46.8 kg)   SpO2 99%   BMI 19.50 kg/m   Visual acuity L: 20/30 R: 20/30 B: 20/30-1   Physical Exam  Constitutional: She is oriented to person, place, and time. She appears well-developed and well-nourished.  HENT:  TM's intact bilaterally, no effusions or erythema. Nasal turbinates pink and moist, nasal passages patent. No sinus tenderness. Oropharynx clear, mucous membranes moist, dentition in good repair.  Eyes: Conjunctivae and EOM are normal. Pupils are equal, round, and reactive to light. Right eye exhibits no discharge. Left eye exhibits no discharge. No scleral icterus.  Neck: Normal range of motion. Neck supple. No thyromegaly present.  Cardiovascular: Normal rate, regular rhythm and intact distal pulses. Exam reveals no gallop and no friction rub.  No murmur heard. Pulmonary/Chest: No respiratory distress. She has no wheezes. She  has no rales.  Abdominal: Soft. Bowel sounds are normal. She exhibits no distension and no mass. There is no tenderness.  Musculoskeletal: Normal range of motion. She exhibits no edema or tenderness.  Lymphadenopathy:    She has no cervical adenopathy.  Neurological: She is alert and oriented to person, place, and time. She has normal reflexes.  Skin: Skin is warm and dry. No rash noted. No erythema. No pallor.  Psychiatric: She has a normal mood and affect.   Assessment and Plan :   1. Annual physical exam - Medically stable, labs pending. Discussed healthy lifestyle, diet, exercise, preventative care, vaccinations, and addressed patient's concerns. Patient will rtc for a pap smear, she did not want this done today.  2.Encounter for hepatitis C screening test for low risk patient - Hepatitis C antibody  3. Need for Tdap vaccination - Tdap will be updated today.  4. Routine screening for STI (sexually transmitted infection) 5. Unprotected sex - Labs pending, counseled on safe sex practices. - GC/Chlamydia Probe Amp - HIV antibody - RPR - Trichomonas vaginalis, RNA   Tami BambergMario Catrina Fellenz, PA-C [facility] [phone] 09/10/2017  5:33 PM

## 2017-09-11 LAB — RPR: RPR: NONREACTIVE

## 2017-09-11 LAB — CBC
HEMATOCRIT: 37 % (ref 34.0–46.6)
Hemoglobin: 12.7 g/dL (ref 11.1–15.9)
MCH: 31.2 pg (ref 26.6–33.0)
MCHC: 34.3 g/dL (ref 31.5–35.7)
MCV: 91 fL (ref 79–97)
PLATELETS: 243 10*3/uL (ref 150–379)
RBC: 4.07 x10E6/uL (ref 3.77–5.28)
RDW: 12.9 % (ref 12.3–15.4)
WBC: 5.9 10*3/uL (ref 3.4–10.8)

## 2017-09-11 LAB — COMPREHENSIVE METABOLIC PANEL
A/G RATIO: 2 (ref 1.2–2.2)
ALT: 12 IU/L (ref 0–32)
AST: 17 IU/L (ref 0–40)
Albumin: 4.5 g/dL (ref 3.5–5.5)
Alkaline Phosphatase: 66 IU/L (ref 39–117)
BILIRUBIN TOTAL: 0.3 mg/dL (ref 0.0–1.2)
BUN/Creatinine Ratio: 16 (ref 9–23)
BUN: 11 mg/dL (ref 6–20)
CALCIUM: 9.4 mg/dL (ref 8.7–10.2)
CHLORIDE: 104 mmol/L (ref 96–106)
CO2: 24 mmol/L (ref 20–29)
Creatinine, Ser: 0.7 mg/dL (ref 0.57–1.00)
GFR calc Af Amer: 143 mL/min/{1.73_m2} (ref >59)
GFR calc non Af Amer: 124 mL/min/{1.73_m2} (ref >59)
GLOBULIN, TOTAL: 2.2 g/dL (ref 1.5–4.5)
Glucose: 83 mg/dL (ref 65–99)
POTASSIUM: 4 mmol/L (ref 3.5–5.2)
SODIUM: 139 mmol/L (ref 134–144)
Total Protein: 6.7 g/dL (ref 6.0–8.5)

## 2017-09-11 LAB — TSH: TSH: 0.862 u[IU]/mL (ref 0.450–4.500)

## 2017-09-11 LAB — HEPATITIS C ANTIBODY: Hep C Virus Ab: <0.1 s/co ratio (ref 0.0–0.9)

## 2017-09-11 LAB — HIV ANTIBODY (ROUTINE TESTING W REFLEX): HIV Screen 4th Generation wRfx: NONREACTIVE

## 2017-09-11 LAB — TRICHOMONAS VAGINALIS, PROBE AMP: TRICH VAG BY NAA: NEGATIVE

## 2017-09-12 LAB — GC/CHLAMYDIA PROBE AMP
Chlamydia trachomatis, NAA: NEGATIVE
Neisseria gonorrhoeae by PCR: NEGATIVE

## 2017-10-15 ENCOUNTER — Ambulatory Visit (INDEPENDENT_AMBULATORY_CARE_PROVIDER_SITE_OTHER): Payer: 59 | Admitting: Physician Assistant

## 2017-10-15 ENCOUNTER — Encounter: Payer: Self-pay | Admitting: Physician Assistant

## 2017-10-15 VITALS — BP 102/80 | HR 67 | Temp 98.9°F | Resp 16 | Ht 61.0 in | Wt 103.0 lb

## 2017-10-15 DIAGNOSIS — Z124 Encounter for screening for malignant neoplasm of cervix: Secondary | ICD-10-CM | POA: Diagnosis not present

## 2017-10-15 DIAGNOSIS — Z01419 Encounter for gynecological examination (general) (routine) without abnormal findings: Secondary | ICD-10-CM | POA: Diagnosis not present

## 2017-10-15 NOTE — Progress Notes (Signed)
   Tami FillerDevyn M Stmarie  MRN: [id number] DOB: 09/01/1996  PCP: Patient, No Pcp Per  Subjective:  Pt is a 22 year old female who presents to clinic for PAP. She had her annual exam 09/10/2018 but wanted to return for PAP. Sexually active with women only. Never had a PAP before. Denies symptoms. Tested negative for GC/chlamydia, RPR, HIV, hep C last month.   Review of Systems  Constitutional: Negative for chills, diaphoresis, fatigue and unexpected weight change.  Gastrointestinal: Negative for abdominal pain.  Genitourinary: Negative for menstrual problem, pelvic pain, vaginal bleeding, vaginal discharge and vaginal pain.    There are no active problems to display for this patient.   No current outpatient medications on file prior to visit.   No current facility-administered medications on file prior to visit.     No Known Allergies   Objective:  BP 102/80 (BP Location: Right Arm, Patient Position: Sitting, Cuff Size: Small)   Pulse 67   Temp 98.9 F (37.2 C) (Oral)   Resp 16   Ht 5\' 1"  (1.549 m)   Wt 103 lb (46.7 kg)   SpO2 99%   BMI 19.46 kg/m   Physical Exam  Constitutional: She is oriented to person, place, and time and well-developed, well-nourished, and in no distress. No distress.  Genitourinary: Vagina normal, uterus normal, cervix normal, right adnexa normal, left adnexa normal and vulva normal.  Neurological: She is alert and oriented to person, place, and time. GCS score is 15.  Skin: Skin is warm and dry.  Psychiatric: Mood, memory, affect and judgment normal.  Vitals reviewed.   Assessment and Plan :  1. Screening for cervical cancer 2. Encounter for gynecological examination without abnormal finding - Pap IG w/ reflex to HPV when [facility] - Pt presents for PAP. Annual exam done 09/10/2018 but she wanted to return for PAP. Sexually active with women only. Never had a PAP before. Asymptomatic. Tested negative for GC/chlamydia, RPR, HIV, hep C last month. Routine  health maintenance provided.   Marco CollieWhitney Marchelle Rinella, PA-C  [facility] at [facility] 10/15/2017 1:58 PM

## 2017-10-15 NOTE — Patient Instructions (Addendum)
For breast health go to [email or web address]  GUIDE TO HAPPY AND HEALTHY LIVING These are some of my general health and wellness recommendations. Some of them may apply to you better than others. Please use common sense as you try these suggestions and feel free to ask me any questions.  ACTIVITY/FITNESS Mental, social, emotional and physical stimulation are very important for brain and body health. Try learning a new activity (arts, music, language, sports, games).  Keep moving your body to the best of your abilities. You can do this at home, inside or outside, the park, community center, gym or anywhere you like. Consider a physical therapist or personal trainer to get started. Consider the app Sworkit. Fitness trackers such as smart-watches, smart-phones or Fitbits can help as well.   NUTRITION Eat more plants: colorful vegetables, nuts, seeds and berries. Eat less sugar, salt, preservatives and processed foods. Avoid toxins such as cigarettes and alcohol. Drink water when you are thirsty. Warm water with a slice of lemon is an excellent morning drink to start the day. Consider these websites for more information: The Nutrition Source (https://www.henry-hernandez.biz/) Precision Nutrition (WindowBlog.ch)   RELAXATION Consider practicing mindfulness meditation or other relaxation techniques such as deep breathing, prayer, yoga, tai chi, massage. See website mindful.org or the apps Headspace or Calm to help get started.   SLEEP Try to get at least 7-8+ hours sleep per day. Regular exercise and reduced caffeine will help you sleep better. Practice good sleep hygeine techniques. See website sleep.org for more information.   Come back and see me if you need anything.  Thank you for coming in today. I hope you feel we met your needs.  Feel free to call PCP if you have any questions or further requests.  Please consider signing up for  MyChart if you do not already have it, as this is a great way to communicate with me.  Best,  Whitney McVey, PA-C  Health Maintenance, Female Adopting a healthy lifestyle and getting preventive care can go a long way to promote health and wellness. Talk with your health care provider about what schedule of regular examinations is right for you. This is a good chance for you to check in with your provider about disease prevention and staying healthy. In between checkups, there are plenty of things you can do on your own. Experts have done a lot of research about which lifestyle changes and preventive measures are most likely to keep you healthy. Ask your health care provider for more information. Weight and diet Eat a healthy diet  Be sure to include plenty of vegetables, fruits, low-fat dairy products, and lean protein.  Do not eat a lot of foods high in solid fats, added sugars, or salt.  Get regular exercise. This is one of the most important things you can do for your health. ? Most adults should exercise for at least 150 minutes each week. The exercise should increase your heart rate and make you sweat (moderate-intensity exercise). ? Most adults should also do strengthening exercises at least twice a week. This is in addition to the moderate-intensity exercise.  Maintain a healthy weight  Body mass index (BMI) is a measurement that can be used to identify possible weight problems. It estimates body fat based on height and weight. Your health care provider can help determine your BMI and help you achieve or maintain a healthy weight.  For females 51 years of age and older: ? A BMI below 18.5 is considered underweight. ?  A BMI of 18.5 to 24.9 is normal. ? A BMI of 25 to 29.9 is considered overweight. ? A BMI of 30 and above is considered obese.  Watch levels of cholesterol and blood lipids  You should start having your blood tested for lipids and cholesterol at 22 years of age, then  have this test every 5 years.  You may need to have your cholesterol levels checked more often if: ? Your lipid or cholesterol levels are high. ? You are older than 22 years of age. ? You are at high risk for heart disease.  Cancer screening  Breast Cancer  Practice breast self-awareness. This means understanding how your breasts normally appear and feel.  It also means doing regular breast self-exams. Let your health care provider know about any changes, no matter how small.  If you are in your 20s or 30s, you should have a clinical breast exam (CBE) by a health care provider every 1-3 years as part of a regular health exam.  If you are 6 or older, have a CBE every year. Also consider having a breast X-ray (mammogram) every year.  If you have a family history of breast cancer, talk to your health care provider about genetic screening.  If you are at high risk for breast cancer, talk to your health care provider about having an MRI and a mammogram every year.  Breast cancer gene (BRCA) assessment is recommended for women who have family members with BRCA-related cancers. BRCA-related cancers include: ? Breast. ? Ovarian. ? Tubal. ? Peritoneal cancers.  Results of the assessment will determine the need for genetic counseling and BRCA1 and BRCA2 testing.  Cervical Cancer Your health care provider may recommend that you be screened regularly for cancer of the pelvic organs (ovaries, uterus, and vagina). This screening involves a pelvic examination, including checking for microscopic changes to the surface of your cervix (Pap test). You may be encouraged to have this screening done every 3 years, beginning at age 5.  For women ages 7-65, health care providers may recommend pelvic exams and Pap testing every 3 years, or they may recommend the Pap and pelvic exam, combined with testing for human papilloma virus (HPV), every 5 years. Some types of HPV increase your risk of cervical  cancer. Testing for HPV may also be done on women of any age with unclear Pap test results.  Other health care providers may not recommend any screening for nonpregnant women who are considered low risk for pelvic cancer and who do not have symptoms. Ask your health care provider if a screening pelvic exam is right for you.  If you have had past treatment for cervical cancer or a condition that could lead to cancer, you need Pap tests and screening for cancer for at least 20 years after your treatment. If Pap tests have been discontinued, your risk factors (such as having a new sexual partner) need to be reassessed to determine if screening should resume. Some women have medical problems that increase the chance of getting cervical cancer. In these cases, your health care provider may recommend more frequent screening and Pap tests.  Skin Cancer  Check your skin from head to toe regularly.  Tell your health care provider about any new moles or changes in moles, especially if there is a change in a mole's shape or color.  Also tell your health care provider if you have a mole that is larger than the size of a pencil eraser.  Always  use sunscreen. Apply sunscreen liberally and repeatedly throughout the day.  Protect yourself by wearing long sleeves, pants, a wide-brimmed hat, and sunglasses whenever you are outside.  Heart disease, diabetes, and high blood pressure  High blood pressure causes heart disease and increases the risk of stroke. High blood pressure is more likely to develop in: ? People who have blood pressure in the high end of the normal range (130-139/85-89 mm Hg). ? People who are overweight or obese. ? People who are African American.  If you are 75-66 years of age, have your blood pressure checked every 3-5 years. If you are 50 years of age or older, have your blood pressure checked every year. You should have your blood pressure measured twice-once when you are at a hospital  or clinic, and once when you are not at a hospital or clinic. Record the average of the two measurements. To check your blood pressure when you are not at a hospital or clinic, you can use: ? An automated blood pressure machine at a pharmacy. ? A home blood pressure monitor.  If you are between 75 years and 54 years old, ask your health care provider if you should take aspirin to prevent strokes.  Have regular diabetes screenings. This involves taking a blood sample to check your fasting blood sugar level. ? If you are at a normal weight and have a low risk for diabetes, have this test once every three years after 22 years of age. ? If you are overweight and have a high risk for diabetes, consider being tested at a younger age or more often. Preventing infection Hepatitis B  If you have a higher risk for hepatitis B, you should be screened for this virus. You are considered at high risk for hepatitis B if: ? You were born in a country where hepatitis B is common. Ask your health care provider which countries are considered high risk. ? Your parents were born in a high-risk country, and you have not been immunized against hepatitis B (hepatitis B vaccine). ? You have HIV or AIDS. ? You use needles to inject street drugs. ? You live with someone who has hepatitis B. ? You have had sex with someone who has hepatitis B. ? You get hemodialysis treatment. ? You take certain medicines for conditions, including cancer, organ transplantation, and autoimmune conditions.  Hepatitis C  Blood testing is recommended for: ? Everyone born from [birth year] through 1965. ? Anyone with known risk factors for hepatitis C.  Sexually transmitted infections (STIs)  You should be screened for sexually transmitted infections (STIs) including gonorrhea and chlamydia if: ? You are sexually active and are younger than 22 years of age. ? You are older than 22 years of age and your health care provider tells you that you  are at risk for this type of infection. ? Your sexual activity has changed since you were last screened and you are at an increased risk for chlamydia or gonorrhea. Ask your health care provider if you are at risk.  If you do not have HIV, but are at risk, it may be recommended that you take a prescription medicine daily to prevent HIV infection. This is called pre-exposure prophylaxis (PrEP). You are considered at risk if: ? You are sexually active and do not regularly use condoms or know the HIV status of your partner(s). ? You take drugs by injection. ? You are sexually active with a partner who has HIV.  Talk with your  health care provider about whether you are at high risk of being infected with HIV. If you choose to begin PrEP, you should first be tested for HIV. You should then be tested every 3 months for as long as you are taking PrEP. Pregnancy  If you are premenopausal and you may become pregnant, ask your health care provider about preconception counseling.  If you may become pregnant, take 400 to 800 micrograms (mcg) of folic acid every day.  If you want to prevent pregnancy, talk to your health care provider about birth control (contraception). Osteoporosis and menopause  Osteoporosis is a disease in which the bones lose minerals and strength with aging. This can result in serious bone fractures. Your risk for osteoporosis can be identified using a bone density scan.  If you are 36 years of age or older, or if you are at risk for osteoporosis and fractures, ask your health care provider if you should be screened.  Ask your health care provider whether you should take a calcium or vitamin D supplement to lower your risk for osteoporosis.  Menopause may have certain physical symptoms and risks.  Hormone replacement therapy may reduce some of these symptoms and risks. Talk to your health care provider about whether hormone replacement therapy is right for you. Follow these  instructions at home:  Schedule regular health, dental, and eye exams.  Stay current with your immunizations.  Do not use any tobacco products including cigarettes, chewing tobacco, or electronic cigarettes.  If you are pregnant, do not drink alcohol.  If you are breastfeeding, limit how much and how often you drink alcohol.  Limit alcohol intake to no more than 1 drink per day for nonpregnant women. One drink equals 12 ounces of beer, 5 ounces of wine, or 1 ounces of hard liquor.  Do not use street drugs.  Do not share needles.  Ask your health care provider for help if you need support or information about quitting drugs.  Tell your health care provider if you often feel depressed.  Tell your health care provider if you have ever been abused or do not feel safe at home. This information is not intended to replace advice given to you by your health care provider. Make sure you discuss any questions you have with your health care provider. Document Released: 03/26/2011 Document Revised: 02/16/2016 Document Reviewed: 06/14/2015 Elsevier Interactive Patient Education  2018 [employer].   IF you received an x-ray today, you will receive an invoice from [facility]. Please contact [facility] at [phone] with questions or concerns regarding your invoice.   IF you received labwork today, you will receive an invoice from [facility]. Please contact [facility] at [phone] with questions or concerns regarding your invoice.   Our billing staff will not be able to assist you with questions regarding bills from these companies.  You will be contacted with the lab results as soon as they are available. The fastest way to get your results is to activate your My Chart account. Instructions are located on the last page of this paperwork. If you have not heard from [place] regarding the results in 2 weeks, please contact this office.

## 2017-10-16 LAB — PAP IG W/ RFLX HPV ASCU: PAP Smear Comment: 0

## 2018-01-01 ENCOUNTER — Encounter: Payer: Self-pay | Admitting: Physician Assistant

## 2018-09-15 ENCOUNTER — Ambulatory Visit (INDEPENDENT_AMBULATORY_CARE_PROVIDER_SITE_OTHER): Payer: 59 | Admitting: Family Medicine

## 2018-09-15 ENCOUNTER — Other Ambulatory Visit: Payer: Self-pay

## 2018-09-15 ENCOUNTER — Encounter: Payer: Self-pay | Admitting: Family Medicine

## 2018-09-15 VITALS — BP 105/68 | HR 63 | Temp 98.8°F | Ht 61.0 in | Wt 103.6 lb

## 2018-09-15 DIAGNOSIS — N898 Other specified noninflammatory disorders of vagina: Secondary | ICD-10-CM | POA: Diagnosis not present

## 2018-09-15 LAB — POCT WET + KOH PREP
Trich by wet prep: ABSENT
Yeast by KOH: ABSENT
Yeast by wet prep: ABSENT

## 2018-09-15 LAB — POCT URINALYSIS DIP (MANUAL ENTRY)
Bilirubin, UA: NEGATIVE
Blood, UA: NEGATIVE
Glucose, UA: NEGATIVE mg/dL
Ketones, POC UA: NEGATIVE mg/dL
Leukocytes, UA: NEGATIVE
Nitrite, UA: NEGATIVE
Protein Ur, POC: NEGATIVE mg/dL
Spec Grav, UA: 1.015 (ref 1.010–1.025)
Urobilinogen, UA: 0.2 E.U./dL
pH, UA: 6.5 (ref 5.0–8.0)

## 2018-09-15 NOTE — Progress Notes (Signed)
   12/23/201911:55 AM  Tami Robinson 01/06/1996, 22 y.o. female [id number]  Chief Complaint  Patient presents with  . Vaginal Pain    in clitoris area, onset last wk, very sensitive    HPI:   Patient is a 22 y.o. female who presents today for vaginal pain  About a week ago she started having vaginal pain/sensitive/sore When she washes or wipes She noticed some particles, "looked like lint" Has also noticed a different odor scant vaginal discharge New body soap, has changed recently to dove No intercourse, but does engage in sexual activity  Fall Risk  09/15/2018 10/15/2017 06/10/2017 05/31/2017  Falls in the past year? 0 No No No     Depression screen [facility] 2/9 09/15/2018 10/15/2017 09/10/2017  Decreased Interest 0 0 0  Down, Depressed, Hopeless 0 - 0  PHQ - 2 Score 0 0 0    No Known Allergies  Prior to Admission medications   Not on File    Past Medical History:  Diagnosis Date  . Nephrotic syndrome     History reviewed. No pertinent surgical history.  Social History   Tobacco Use  . Smoking status: Never Smoker  . Smokeless tobacco: Never Used  Substance Use Topics  . Alcohol use: No    History reviewed. No pertinent family history.  ROS Per hpi  OBJECTIVE:  Blood pressure 105/68, pulse 63, temperature 98.8 F (37.1 C), temperature source Oral, height 5\' 1"  (1.549 m), weight 103 lb 9.6 oz (47 kg), SpO2 99 %. Body mass index is 19.58 kg/m.   Physical Exam Gen: AAOx3, NAD GU: mild vulvar irritation, no lesions or swelling Scant white chunky vaginal discharge  Results for orders placed or performed in visit on 09/15/18 (from the past 24 hour(s))  POCT urinalysis dipstick     Status: None   Collection Time: 09/15/18 11:49 AM  Result Value Ref Range   Color, UA yellow yellow   Clarity, UA clear clear   Glucose, UA negative negative mg/dL   Bilirubin, UA negative negative   Ketones, POC UA negative negative mg/dL   Spec Grav, UA [phone] - 1.025    Blood, UA negative negative   pH, UA 6.5 5.0 - 8.0   Protein Ur, POC negative negative mg/dL   Urobilinogen, UA 0.2 0.2 or 1.0 E.U./dL   Nitrite, UA Negative Negative   Leukocytes, UA Negative Negative  POCT Wet + KOH Prep     Status: Abnormal   Collection Time: 09/15/18 12:13 PM  Result Value Ref Range   Yeast by KOH Absent Absent   Yeast by wet prep Absent Absent   WBC by wet prep Few Few   Clue Cells Wet Prep HPF POC None None   Trich by wet prep Absent Absent   Bacteria Wet Prep HPF POC Many (A) Few   Epithelial Cells By [employer] Financial[employer]et Pref ([facility]) Few None, Few, Too numerous to count   RBC,UR,HPF,POC None None RBC/hpf     ASSESSMENT and PLAN  1. Vaginal irritation So far workup negative. Discussed supportive measures, stop new soap.  RTC precautions discussed  2. Vaginal discharge - POCT urinalysis dipstick - POCT Wet + KOH Prep - GC/Chlamydia Probe Amp([facility])    Return if symptoms worsen or fail to improve.    Myles LippsIrma M Santiago, MD [facility] [address] [place], [place] [address] Ph.  [phone] Fax [phone]

## 2018-09-15 NOTE — Patient Instructions (Addendum)
     If you have lab work done today you will be contacted with your lab results within the next 2 weeks.  If you have not heard from us then please contact us. The fastest way to get your results is to register for My Chart.   IF you received an x-ray today, you will receive an invoice from [facility]. Please contact [facility] at [phone] with questions or concerns regarding your invoice.   IF you received labwork today, you will receive an invoice from [facility]. Please contact [facility] at [phone] with questions or concerns regarding your invoice.   Our billing staff will not be able to assist you with questions regarding bills from these companies.  You will be contacted with the lab results as soon as they are available. The fastest way to get your results is to activate your My Chart account. Instructions are located on the last page of this paperwork. If you have not heard from us regarding the results in 2 weeks, please contact this office.     Vaginitis  Vaginitis is irritation and swelling (inflammation) of the vagina. It happens when normal bacteria and yeast in the vagina grow too much. There are many types of this condition. Treatment will depend on the type you have. Follow these instructions at home: Lifestyle  Keep your vagina area clean and dry. ? Avoid using soap. ? Rinse the area with water.  Do not do the following until your doctor says it is okay: ? Wash and clean out the vagina (douche). ? Use tampons. ? Have sex.  Wipe from front to back after going to the bathroom.  Let air reach your vagina. ? Wear cotton underwear. ? Do not wear: ? Underwear while you sleep. ? Tight pants. ? Thong underwear. ? Underwear or nylons without a cotton panel. ? Take off any wet clothing, such as bathing suits, as soon as possible.  Use gentle, non-scented products. Do not use things that can irritate the vagina, such as fabric softeners. Avoid  the following products if they are scented: ? Feminine sprays. ? Detergents. ? Tampons. ? Feminine hygiene products. ? Soaps or bubble baths.  Practice safe sex and use condoms. General instructions  Take over-the-counter and prescription medicines only as told by your doctor.  If you were prescribed an antibiotic medicine, take or use it as told by your doctor. Do not stop taking or using the antibiotic even if you start to feel better.  Keep all follow-up visits as told by your doctor. This is important. Contact a doctor if:  You have pain in your belly.  You have a fever.  Your symptoms last for more than 2-3 days. Get help right away if:  You have a fever and your symptoms get worse all of a sudden. Summary  Vaginitis is irritation and swelling of the vagina. It can happen when the normal bacteria and yeast in the vagina grow too much. There are many types.  Treatment will depend on the type you have.  Do not douche, use tampons , or have sex until your health care provider approves. When you can return to sex, practice safe sex and use condoms. This information is not intended to replace advice given to you by your health care provider. Make sure you discuss any questions you have with your health care provider. Document Released: 12/07/2008 Document Revised: 10/02/2016 Document Reviewed: 10/02/2016 Elsevier Interactive Patient Education  2019 [employer]

## 2018-09-19 LAB — GC/CHLAMYDIA PROBE AMP
Chlamydia trachomatis, NAA: NEGATIVE
Neisseria gonorrhoeae by PCR: NEGATIVE

## 2018-11-04 ENCOUNTER — Ambulatory Visit: Payer: 59 | Admitting: Emergency Medicine

## 2018-12-09 ENCOUNTER — Ambulatory Visit (INDEPENDENT_AMBULATORY_CARE_PROVIDER_SITE_OTHER): Payer: 59 | Admitting: Family Medicine

## 2018-12-09 ENCOUNTER — Other Ambulatory Visit: Payer: Self-pay

## 2018-12-09 VITALS — BP 112/76 | HR 82 | Temp 98.2°F | Resp 14 | Ht 62.0 in | Wt 111.4 lb

## 2018-12-09 DIAGNOSIS — Z113 Encounter for screening for infections with a predominantly sexual mode of transmission: Secondary | ICD-10-CM

## 2018-12-09 DIAGNOSIS — N898 Other specified noninflammatory disorders of vagina: Secondary | ICD-10-CM | POA: Diagnosis not present

## 2018-12-09 LAB — POCT URINALYSIS DIP (MANUAL ENTRY)
BILIRUBIN UA: NEGATIVE mg/dL
Bilirubin, UA: NEGATIVE
Blood, UA: NEGATIVE
Glucose, UA: NEGATIVE mg/dL
LEUKOCYTES UA: NEGATIVE
Nitrite, UA: NEGATIVE
SPEC GRAV UA: 1.025 (ref 1.010–1.025)
Urobilinogen, UA: 0.2 E.U./dL
pH, UA: 6.5 (ref 5.0–8.0)

## 2018-12-09 LAB — POCT WET + KOH PREP
TRICH BY WET PREP: ABSENT
YEAST BY KOH: ABSENT
Yeast by wet prep: ABSENT

## 2018-12-09 MED ORDER — METRONIDAZOLE 500 MG PO TABS: 500.0000 mg | ORAL_TABLET | Freq: Two times a day (BID) | ORAL | 0 refills | Status: DC

## 2018-12-09 MED ORDER — FLUCONAZOLE 150 MG PO TABS: 150.0000 mg | ORAL_TABLET | Freq: Once | ORAL | 0 refills | Status: DC

## 2018-12-09 NOTE — Progress Notes (Signed)
Subjective:    Patient ID: Tami Robinson, female    DOB: [birth year], 23 y.o.   MRN: [id number]  HPI Tami Robinson is a 23 y.o. female Presents today for: Chief Complaint  Patient presents with  . Vaginitis    white creamy discharge. was seen here 09/15/2018 was not given any med was told to change soap. Changed soap and I have changed under wear and has not helped   Evaluated 09/15/2018 for vaginal discharge, odor.  Negative urinalysis, negative wet prep, negative chlamydia and gonorrhea testing at that time.  Supportive measures discussed, recommended to stop the new soap at that time.   Noticed vaginal odor and white discharge.  Min in December, more consistently past few weeks.  No urinary symptoms. No abd or pelvic pain. Uses Dove for body, unscented Dove for genital area. Wearing larger underwear, cotton blend.  Female and female partners, New partner in January, unprotected sexual contact at that time.  No known hx of STI.  Declined HIV/RPR testing.      There are no active problems to display for this patient.  Past Medical History:  Diagnosis Date  . Nephrotic syndrome    No past surgical history on file. No Known Allergies Prior to Admission medications   Not on File   Social History   Socioeconomic History  . Marital status: Single    Spouse name: Not on file  . Number of children: Not on file  . Years of education: Not on file  . Highest education level: Not on file  Occupational History  . Not on file  Social Needs  . Financial resource strain: Not on file  . Food insecurity:    Worry: Not on file    Inability: Not on file  . Transportation needs:    Medical: Not on file    Non-medical: Not on file  Tobacco Use  . Smoking status: Never Smoker  . Smokeless tobacco: Never Used  Substance and Sexual Activity  . Alcohol use: No  . Drug use: No  . Sexual activity: Never  Lifestyle  . Physical activity:    Days per week: Not on file    Minutes per  session: Not on file  . Stress: Not on file  Relationships  . Social connections:    Talks on phone: Not on file    Gets together: Not on file    Attends religious service: Not on file    Active member of club or organization: Not on file    Attends meetings of clubs or organizations: Not on file    Relationship status: Not on file  . Intimate partner violence:    Fear of current or ex partner: Not on file    Emotionally abused: Not on file    Physically abused: Not on file    Forced sexual activity: Not on file  Other Topics Concern  . Not on file  Social History Narrative  . Not on file    Review of Systems     Objective:   Physical Exam Constitutional:      General: She is not in acute distress.    Appearance: She is well-developed.  HENT:     Head: Normocephalic and atraumatic.  Cardiovascular:     Rate and Rhythm: Normal rate.  Pulmonary:     Effort: Pulmonary effort is normal.  Abdominal:     General: Abdomen is flat. There is no distension.     Palpations: Abdomen  is soft.     Tenderness: There is no abdominal tenderness. There is no right CVA tenderness, left CVA tenderness, guarding or rebound.  Neurological:     Mental Status: She is alert and oriented to person, place, and time.    Vitals:   12/09/18 1703  BP: 112/76  Pulse: 82  Resp: 14  Temp: 98.2 F (36.8 C)  TempSrc: Oral  SpO2: 98%  Weight: 111 lb 6.4 oz (50.5 kg)  Height:  (1.575 m)     Results for orders placed or performed in visit on 12/09/18  POCT urinalysis dipstick  Result Value Ref Range   Color, UA yellow yellow   Clarity, UA clear clear   Glucose, UA negative negative mg/dL   Bilirubin, UA negative negative   Ketones, POC UA negative negative mg/dL   Spec Grav, UA 1.324 4.010 - 1.025   Blood, UA negative negative   pH, UA 6.5 5.0 - 8.0   Protein Ur, POC trace (A) negative mg/dL   Urobilinogen, UA 0.2 0.2 or 1.0 E.U./dL   Nitrite, UA Negative Negative   Leukocytes,  UA Negative Negative  POCT Wet + KOH Prep  Result Value Ref Range   Yeast by KOH Absent Absent   Yeast by wet prep Absent Absent   WBC by wet prep Few Few   Clue Cells Wet Prep HPF POC Few (A) None   Trich by wet prep Absent Absent   Bacteria Wet Prep HPF POC Many (A) Few   Epithelial Cells By [employer] Pref ([facility]) Moderate (A) None, Few, Too numerous to count   RBC,UR,HPF,POC Few (A) None RBC/hpf   Self wet prep obtained.     Assessment & Plan:    Tami Robinson is a 23 y.o. female Vaginal discharge - Plan: POCT urinalysis dipstick, POCT Wet + KOH Prep, metroNIDAZOLE (FLAGYL) 500 MG tablet, GC/Chlamydia Probe Amp, DISCONTINUED: fluconazole (DIFLUCAN) 150 MG tablet  Routine screening for STI (sexually transmitted infection) - Plan: GC/Chlamydia Probe Amp  Increase in discharge as above.  Few clue cells, but will try initial treatment with metronidazole 500 mg twice daily x1 week for possible bacterial vaginosis.  Potential side effects discussed and strict avoidance of alcohol reviewed.  STI testing obtained with chlamydia and gonorrhea testing, both negative.  Other STI testing declined.  RTC precautions if persistent, and if so would recommend pelvic exam at that time as well as other possible testing.  Meds ordered this encounter  Medications  . DISCONTD: fluconazole (DIFLUCAN) 150 MG tablet    Sig: Take 1 tablet (150 mg total) by mouth once for 1 dose.    Dispense:  1 tablet    Refill:  0  . metroNIDAZOLE (FLAGYL) 500 MG tablet    Sig: Take 1 tablet (500 mg total) by mouth 2 (two) times daily.    Dispense:  14 tablet    Refill:  0   Patient Instructions    Can try flagyl for possible mild bacterial vaginosis.  See information below.  If discharge is not improving with this treatment or any worsening symptoms, return for recheck and exam.    Bacterial Vaginosis  Bacterial vaginosis is a vaginal infection that occurs when the normal balance of bacteria in the vagina is  disrupted. It results from an overgrowth of certain bacteria. This is the most common vaginal infection among women ages 23-44. Because bacterial vaginosis increases your risk for STIs (sexually transmitted infections), getting treated can help reduce your risk for  chlamydia, gonorrhea, herpes, and HIV (human immunodeficiency virus). Treatment is also important for preventing complications in pregnant women, because this condition can cause an early (premature) delivery. What are the causes? This condition is caused by an increase in harmful bacteria that are normally present in small amounts in the vagina. However, the reason that the condition develops is not fully understood. What increases the risk? The following factors may make you more likely to develop this condition:  Having a new sexual partner or multiple sexual partners.  Having unprotected sex.  Douching.  Having an intrauterine device (IUD).  Smoking.  Drug and alcohol abuse.  Taking certain antibiotic medicines.  Being pregnant. You cannot get bacterial vaginosis from toilet seats, bedding, swimming pools, or contact with objects around you. What are the signs or symptoms? Symptoms of this condition include:  Grey or white vaginal discharge. The discharge can also be watery or foamy.  A fish-like odor with discharge, especially after sexual intercourse or during menstruation.  Itching in and around the vagina.  Burning or pain with urination. Some women with bacterial vaginosis have no signs or symptoms. How is this diagnosed? This condition is diagnosed based on:  Your medical history.  A physical exam of the vagina.  Testing a sample of vaginal fluid under a microscope to look for a large amount of bad bacteria or abnormal cells. Your health care provider may use a cotton swab or a small wooden spatula to collect the sample. How is this treated? This condition is treated with antibiotics. These may be given  as a pill, a vaginal cream, or a medicine that is put into the vagina (suppository). If the condition comes back after treatment, a second round of antibiotics may be needed. Follow these instructions at home: Medicines  Take over-the-counter and prescription medicines only as told by your health care provider.  Take or use your antibiotic as told by your health care provider. Do not stop taking or using the antibiotic even if you start to feel better. General instructions  If you have a female sexual partner, tell her that you have a vaginal infection. She should see her health care provider and be treated if she has symptoms. If you have a female sexual partner, he does not need treatment.  During treatment: ? Avoid sexual activity until you finish treatment. ? Do not douche. ? Avoid alcohol as directed by your health care provider. ? Avoid breastfeeding as directed by your health care provider.  Drink enough water and fluids to keep your urine clear or pale yellow.  Keep the area around your vagina and rectum clean. ? Wash the area daily with warm water. ? Wipe yourself from front to back after using the toilet.  Keep all follow-up visits as told by your health care provider. This is important. How is this prevented?  Do not douche.  Wash the outside of your vagina with warm water only.  Use protection when having sex. This includes latex condoms and dental dams.  Limit how many sexual partners you have. To help prevent bacterial vaginosis, it is best to have sex with just one partner (monogamous).  Make sure you and your sexual partner are tested for STIs.  Wear cotton or cotton-lined underwear.  Avoid wearing tight pants and pantyhose, especially during summer.  Limit the amount of alcohol that you drink.  Do not use any products that contain nicotine or tobacco, such as cigarettes and e-cigarettes. If you need help quitting, ask  your health care provider.  Do not use  illegal drugs. Where to find more information  Centers for Disease Control and Prevention: SolutionApps.co.za  American Sexual Health Association (ASHA): www.ashastd.org  U.S. Department of Health and Health and safety inspector, Office on Women's Health: ConventionalMedicines.si or http://www.anderson-williamson.info/ Contact a health care provider if:  Your symptoms do not improve, even after treatment.  You have more discharge or pain when urinating.  You have a fever.  You have pain in your abdomen.  You have pain during sex.  You have vaginal bleeding between periods. Summary  Bacterial vaginosis is a vaginal infection that occurs when the normal balance of bacteria in the vagina is disrupted.  Because bacterial vaginosis increases your risk for STIs (sexually transmitted infections), getting treated can help reduce your risk for chlamydia, gonorrhea, herpes, and HIV (human immunodeficiency virus). Treatment is also important for preventing complications in pregnant women, because the condition can cause an early (premature) delivery.  This condition is treated with antibiotic medicines. These may be given as a pill, a vaginal cream, or a medicine that is put into the vagina (suppository). This information is not intended to replace advice given to you by your health care provider. Make sure you discuss any questions you have with your health care provider. Document Released: 09/10/2005 Document Revised: 01/14/2017 Document Reviewed: 05/26/2016 Elsevier Interactive Patient Education  [employer].   If you have lab work done today you will be contacted with your lab results within the next 2 weeks.  If you have not heard from [place] then please contact us. The fastest way to get your results is to register for My Chart.   IF you received an x-ray today, you will receive an invoice from [facility]. Please contact [facility] at [phone] with  questions or concerns regarding your invoice.   IF you received labwork today, you will receive an invoice from [place]. Please contact [facility] at [phone] with questions or concerns regarding your invoice.   Our billing staff will not be able to assist you with questions regarding bills from these companies.  You will be contacted with the lab results as soon as they are available. The fastest way to get your results is to activate your My Chart account. Instructions are located on the last page of this paperwork. If you have not heard from [place] regarding the results in 2 weeks, please contact this office.       Signed,   Meredith Staggers, MD [facility].  12/10/18 9:45 PM

## 2018-12-09 NOTE — Patient Instructions (Addendum)
Can try flagyl for possible mild bacterial vaginosis.  See information below.  If discharge is not improving with this treatment or any worsening symptoms, return for recheck and exam.    Bacterial Vaginosis  Bacterial vaginosis is a vaginal infection that occurs when the normal balance of bacteria in the vagina is disrupted. It results from an overgrowth of certain bacteria. This is the most common vaginal infection among women ages 14-44. Because bacterial vaginosis increases your risk for STIs (sexually transmitted infections), getting treated can help reduce your risk for chlamydia, gonorrhea, herpes, and HIV (human immunodeficiency virus). Treatment is also important for preventing complications in pregnant women, because this condition can cause an early (premature) delivery. What are the causes? This condition is caused by an increase in harmful bacteria that are normally present in small amounts in the vagina. However, the reason that the condition develops is not fully understood. What increases the risk? The following factors may make you more likely to develop this condition:  Having a new sexual partner or multiple sexual partners.  Having unprotected sex.  Douching.  Having an intrauterine device (IUD).  Smoking.  Drug and alcohol abuse.  Taking certain antibiotic medicines.  Being pregnant. You cannot get bacterial vaginosis from toilet seats, bedding, swimming pools, or contact with objects around you. What are the signs or symptoms? Symptoms of this condition include:  Grey or white vaginal discharge. The discharge can also be watery or foamy.  A fish-like odor with discharge, especially after sexual intercourse or during menstruation.  Itching in and around the vagina.  Burning or pain with urination. Some women with bacterial vaginosis have no signs or symptoms. How is this diagnosed? This condition is diagnosed based on:  Your medical history.  A  physical exam of the vagina.  Testing a sample of vaginal fluid under a microscope to look for a large amount of bad bacteria or abnormal cells. Your health care provider may use a cotton swab or a small wooden spatula to collect the sample. How is this treated? This condition is treated with antibiotics. These may be given as a pill, a vaginal cream, or a medicine that is put into the vagina (suppository). If the condition comes back after treatment, a second round of antibiotics may be needed. Follow these instructions at home: Medicines  Take over-the-counter and prescription medicines only as told by your health care provider.  Take or use your antibiotic as told by your health care provider. Do not stop taking or using the antibiotic even if you start to feel better. General instructions  If you have a female sexual partner, tell her that you have a vaginal infection. She should see her health care provider and be treated if she has symptoms. If you have a female sexual partner, he does not need treatment.  During treatment: ? Avoid sexual activity until you finish treatment. ? Do not douche. ? Avoid alcohol as directed by your health care provider. ? Avoid breastfeeding as directed by your health care provider.  Drink enough water and fluids to keep your urine clear or pale yellow.  Keep the area around your vagina and rectum clean. ? Wash the area daily with warm water. ? Wipe yourself from front to back after using the toilet.  Keep all follow-up visits as told by your health care provider. This is important. How is this prevented?  Do not douche.  Wash the outside of your vagina with warm water only.  Use protection  when having sex. This includes latex condoms and dental dams.  Limit how many sexual partners you have. To help prevent bacterial vaginosis, it is best to have sex with just one partner (monogamous).  Make sure you and your sexual partner are tested for  STIs.  Wear cotton or cotton-lined underwear.  Avoid wearing tight pants and pantyhose, especially during summer.  Limit the amount of alcohol that you drink.  Do not use any products that contain nicotine or tobacco, such as cigarettes and e-cigarettes. If you need help quitting, ask your health care provider.  Do not use illegal drugs. Where to find more information  Centers for Disease Control and Prevention: SolutionApps.co.za  American Sexual Health Association (ASHA): www.ashastd.org  U.S. Department of Health and Health and safety inspector, Office on Women's Health: ConventionalMedicines.si or http://www.anderson-williamson.info/ Contact a health care provider if:  Your symptoms do not improve, even after treatment.  You have more discharge or pain when urinating.  You have a fever.  You have pain in your abdomen.  You have pain during sex.  You have vaginal bleeding between periods. Summary  Bacterial vaginosis is a vaginal infection that occurs when the normal balance of bacteria in the vagina is disrupted.  Because bacterial vaginosis increases your risk for STIs (sexually transmitted infections), getting treated can help reduce your risk for chlamydia, gonorrhea, herpes, and HIV (human immunodeficiency virus). Treatment is also important for preventing complications in pregnant women, because the condition can cause an early (premature) delivery.  This condition is treated with antibiotic medicines. These may be given as a pill, a vaginal cream, or a medicine that is put into the vagina (suppository). This information is not intended to replace advice given to you by your health care provider. Make sure you discuss any questions you have with your health care provider. Document Released: 09/10/2005 Document Revised: 01/14/2017 Document Reviewed: 05/26/2016 Elsevier Interactive Patient Education  [employer].   If you have lab work done today you will  be contacted with your lab results within the next 2 weeks.  If you have not heard from [place] then please contact us. The fastest way to get your results is to register for My Chart.   IF you received an x-ray today, you will receive an invoice from [facility]. Please contact [facility] at [phone] with questions or concerns regarding your invoice.   IF you received labwork today, you will receive an invoice from [place]. Please contact [facility] at [phone] with questions or concerns regarding your invoice.   Our billing staff will not be able to assist you with questions regarding bills from these companies.  You will be contacted with the lab results as soon as they are available. The fastest way to get your results is to activate your My Chart account. Instructions are located on the last page of this paperwork. If you have not heard from [place] regarding the results in 2 weeks, please contact this office.

## 2018-12-10 ENCOUNTER — Encounter: Payer: Self-pay | Admitting: Family Medicine

## 2018-12-10 LAB — GC/CHLAMYDIA PROBE AMP
Chlamydia trachomatis, NAA: NEGATIVE
Neisseria gonorrhoeae by PCR: NEGATIVE

## 2019-03-18 ENCOUNTER — Other Ambulatory Visit: Payer: Self-pay

## 2019-03-18 ENCOUNTER — Encounter: Payer: Self-pay | Admitting: Family Medicine

## 2019-03-18 ENCOUNTER — Ambulatory Visit (INDEPENDENT_AMBULATORY_CARE_PROVIDER_SITE_OTHER): Payer: 59 | Admitting: Family Medicine

## 2019-03-18 VITALS — BP 108/66 | HR 70 | Temp 98.2°F | Ht 61.02 in | Wt 104.8 lb

## 2019-03-18 DIAGNOSIS — N898 Other specified noninflammatory disorders of vagina: Secondary | ICD-10-CM | POA: Diagnosis not present

## 2019-03-18 LAB — POCT URINALYSIS DIP (MANUAL ENTRY)
Bilirubin, UA: NEGATIVE
Blood, UA: NEGATIVE
Glucose, UA: NEGATIVE mg/dL
Leukocytes, UA: NEGATIVE
Nitrite, UA: NEGATIVE
Protein Ur, POC: NEGATIVE mg/dL
Spec Grav, UA: 1.025 (ref 1.010–1.025)
Urobilinogen, UA: 2 E.U./dL — AB
pH, UA: 6 (ref 5.0–8.0)

## 2019-03-18 LAB — POC MICROSCOPIC URINALYSIS (UMFC): Mucus: ABSENT

## 2019-03-18 LAB — POCT WET + KOH PREP
Trich by wet prep: ABSENT
Yeast by KOH: ABSENT
Yeast by wet prep: ABSENT

## 2019-03-18 MED ORDER — CLINDAMYCIN PHOSPHATE 100 MG VA SUPP: 100.0000 mg | Freq: Every day | VAGINAL | 0 refills | Status: DC

## 2019-03-18 NOTE — Patient Instructions (Signed)
° ° ° °  If you have lab work done today you will be contacted with your lab results within the next 2 weeks.  If you have not heard from us then please contact us. The fastest way to get your results is to register for My Chart. ° ° °IF you received an x-ray today, you will receive an invoice from [facility]. Please contact [facility] at [phone] with questions or concerns regarding your invoice.  ° °IF you received labwork today, you will receive an invoice from [facility]. Please contact [facility] at [phone] with questions or concerns regarding your invoice.  ° °Our billing staff will not be able to assist you with questions regarding bills from these companies. ° °You will be contacted with the lab results as soon as they are available. The fastest way to get your results is to activate your My Chart account. Instructions are located on the last page of this paperwork. If you have not heard from us regarding the results in 2 weeks, please contact this office. °  ° ° ° °

## 2019-03-18 NOTE — Progress Notes (Signed)
Acute Office Visit  Subjective:    Patient ID: Tami FillerDevyn M Delker, female    DOB: 10/14/1995, 23 y.o.   MRN: [id number]  Chief Complaint  Patient presents with  . Vaginal Discharge    X 1.5 week  . Vaginal Itching    X 1.5 week  . Mass    X 2 weeks above left eye    HPI Patient is in today for for vaginal discharge noted for the last 1-2 weeks. Pt took flagyl for BV in March. Pt did not complete the medication due to taste and difficulty taking medication. Pt with different partner currently. NO concerns with partner having a STD. Marland Kitchen. pts partner is female. Pt states she has irritation internally. Pain with urination  Vaginal discharge noted on undergarments  Past Medical History:  Diagnosis Date  . Nephrotic syndrome     Social History   Socioeconomic History  . Marital status: Single    Spouse name: Not on file  . Number of children: 0  . Years of education: Not on file  . Highest education level: Not on file  Occupational History  . Not on file  Social Needs  . Financial resource strain: Not on file  . Food insecurity    Worry: Not on file    Inability: Not on file  . Transportation needs    Medical: Not on file    Non-medical: Not on file  Tobacco Use  . Smoking status: Never Smoker  . Smokeless tobacco: Never Used  Substance and Sexual Activity  . Alcohol use: Yes    Alcohol/week: 2.0 standard drinks    Types: 2 Shots of liquor per week  . Drug use: No  . Sexual activity: Yes    Birth control/protection: None  Lifestyle  . Physical activity    Days per week: Not on file    Minutes per session: Not on file  . Stress: Not on file  Relationships  . Social Musicianconnections    Talks on phone: Not on file    Gets together: Not on file    Attends religious service: Not on file    Active member of club or organization: Not on file    Attends meetings of clubs or organizations: Not on file    Relationship status: Not on file  . Intimate partner violence    Fear of  current or ex partner: Not on file    Emotionally abused: Not on file    Physically abused: Not on file    Forced sexual activity: Not on file  Other Topics Concern  . Not on file  Social History Narrative  . Not on file    Outpatient Medications Prior to Visit  Medication Sig Dispense Refill  . metroNIDAZOLE (FLAGYL) 500 MG tablet Take 1 tablet (500 mg total) by mouth 2 (two) times daily. (Patient not taking: Reported on 03/18/2019) 14 tablet 0   No facility-administered medications prior to visit.     No Known Allergies  Review of Systems  Constitutional: Negative for chills and fever.  Respiratory: Negative for cough.   Genitourinary: Positive for dysuria.   Vaginal discharge-thick /white    Objective:    Physical Exam  Constitutional: She appears well-developed and well-nourished.  Genitourinary:    Uterus normal.     Vaginal discharge present.   cells collected for wet prep  Pulse 70   Temp 98.2 F (36.8 C) (Oral)   Ht 5' 1.02" (1.55 m)   Wt  104 lb 12.8 oz (47.5 kg)   LMP 03/06/2019 (Exact Date)   SpO2 98%   BMI 19.79 kg/m  Wt Readings from Last 3 Encounters:  03/18/19 104 lb 12.8 oz (47.5 kg)  12/09/18 111 lb 6.4 oz (50.5 kg)  09/15/18 103 lb 9.6 oz (47 kg)     Lab Results  Component Value Date   TSH 0.862 09/10/2017   Lab Results  Component Value Date   WBC 5.9 09/10/2017   HGB 12.7 09/10/2017   HCT 37.0 09/10/2017   MCV 91 09/10/2017   PLT 243 09/10/2017   Lab Results  Component Value Date   NA 139 09/10/2017   K 4.0 09/10/2017   CO2 24 09/10/2017   GLUCOSE 83 09/10/2017   BUN 11 09/10/2017   CREATININE 0.70 09/10/2017   BILITOT 0.3 09/10/2017   ALKPHOS 66 09/10/2017   AST 17 09/10/2017   ALT 12 09/10/2017   PROT 6.7 09/10/2017   ALBUMIN 4.5 09/10/2017   CALCIUM 9.4 09/10/2017   No results found for: [facility] Lab Results  Component Value Date   HDL 39 04/24/2009   No results found for: [facility] Lab Results  Component Value Date    TRIG 38 04/24/2009     Assessment & Plan:    1. Vaginal discharge Clue cell, no tric, no yeast-cleocin vaginal supp-sent-pt with difficulty taking oral medication-took part of flagyl with no relief of symptoms - POCT Wet + KOH Prep - POCT Microscopic Urinalysis ([facility]) - POCT urinalysis dipstick  Fabrizio Filip Hannah Beat, MD

## 2019-03-19 ENCOUNTER — Telehealth: Payer: Self-pay | Admitting: Family Medicine

## 2019-03-19 NOTE — Telephone Encounter (Signed)
Copied from Edgar [id number]. Topic: General - Other >> Mar 18, 2019  5:40 PM Mcneil, Ja-Kwan wrote: Reason for CRM: Pt stated she went over to her pharmacy to pick up her Rx and she was told that it will not be ready until tomorrow after 3 pm. Pt stated she was told the cost would be $171. Pt would like to know if there is an otc medication that she can use until she can afford to pay for the Rx. Pt requests call back.

## 2019-03-23 NOTE — Telephone Encounter (Signed)
Is there a medication that might be cheaper for pt to take?

## 2019-03-23 NOTE — Telephone Encounter (Signed)
Please advise 

## 2019-03-24 ENCOUNTER — Other Ambulatory Visit: Payer: Self-pay | Admitting: Family Medicine

## 2019-03-24 MED ORDER — METRONIDAZOLE 0.75 % VA GEL: VAGINAL | 0 refills | Status: DC

## 2019-03-24 NOTE — Telephone Encounter (Signed)
Sent metrogel to pharmacy for pt to start tonight-should be cheaper

## 2019-03-24 NOTE — Telephone Encounter (Signed)
Pt stated that she had done the vaginal one and completed the regimen she is feeling better so I have informed her that she doesn't have to worry about the one at the pharmacy then.

## 2019-05-01 ENCOUNTER — Ambulatory Visit: Payer: 59 | Admitting: Family Medicine

## 2019-05-12 ENCOUNTER — Ambulatory Visit (INDEPENDENT_AMBULATORY_CARE_PROVIDER_SITE_OTHER): Payer: 59 | Admitting: Family Medicine

## 2019-05-12 ENCOUNTER — Other Ambulatory Visit (HOSPITAL_COMMUNITY)
Admission: RE | Admit: 2019-05-12 | Discharge: 2019-05-12 | Disposition: A | Discharge status: Home / Self Care | Payer: 59 | Source: Ambulatory Visit | Attending: Family Medicine | Admitting: Family Medicine

## 2019-05-12 ENCOUNTER — Other Ambulatory Visit: Payer: Self-pay

## 2019-05-12 ENCOUNTER — Encounter: Payer: Self-pay | Admitting: Family Medicine

## 2019-05-12 VITALS — HR 58 | Temp 98.8°F | Ht 61.02 in | Wt 108.6 lb

## 2019-05-12 DIAGNOSIS — N898 Other specified noninflammatory disorders of vagina: Secondary | ICD-10-CM | POA: Insufficient documentation

## 2019-05-12 NOTE — Progress Notes (Signed)
   8/18/20202:55 PM  Tami Robinson 03-05-1996, 23 y.o., female [id number]  Chief Complaint  Patient presents with  . Vaginal Discharge    having discharge and odor, cannot swallow the flagyl, the inserts seems to work for a while, but sx came back    HPI:   Patient is a 23 y.o. female who presents today for vaginal discharge  Treated for BV in June 2020 with vaginal flagyl Incomplete treatment but sx had resolved Vaginal discharge, white with mal odor returned about 10 days ago [employer] 3 days - no improvement Denies vaginal irritation, itchiness, pelvic pain, nausea, vomiting LMP 05/04/2019  Depression screen [facility] 2/9 03/18/2019 12/09/2018 09/15/2018  Decreased Interest 0 0 0  Down, Depressed, Hopeless 0 0 0  PHQ - 2 Score 0 0 0    Fall Risk  05/12/2019 03/18/2019 12/09/2018 09/15/2018 10/15/2017  Falls in the past year? 0 0 0 0 No  Number falls in past yr: 0 0 0 - -  Injury with Fall? 0 0 0 - -  Follow up - Falls evaluation completed - - -     No Known Allergies  Prior to Admission medications   Not on File    Past Medical History:  Diagnosis Date  . Nephrotic syndrome     No past surgical history on file.  Social History   Tobacco Use  . Smoking status: Never Smoker  . Smokeless tobacco: Never Used  Substance Use Topics  . Alcohol use: Yes    Alcohol/week: 2.0 standard drinks    Types: 2 Shots of liquor per week    No family history on file.  ROS Per hpi  OBJECTIVE:  Today's Vitals   05/12/19 1451  Pulse: (!) 58  Temp: 98.8 F (37.1 C)  TempSrc: Oral  SpO2: 98%  Weight: 108 lb 9.6 oz (49.3 kg)  Height: 5' 1.02" (1.55 m)   Body mass index is 20.51 kg/m.   Physical Exam Vitals signs and nursing note reviewed. Exam conducted with a chaperone present.  Constitutional:      Appearance: She is well-developed.  HENT:     Head: Normocephalic and atraumatic.     Mouth/Throat:     Pharynx: No oropharyngeal exudate.  Eyes:     General: No  scleral icterus.    Conjunctiva/sclera: Conjunctivae normal.     Pupils: Pupils are equal, round, and reactive to light.  Neck:     Musculoskeletal: Neck supple.  Cardiovascular:     Rate and Rhythm: Normal rate and regular rhythm.     Heart sounds: Normal heart sounds. No murmur. No friction rub. No gallop.   Pulmonary:     Effort: Pulmonary effort is normal.     Breath sounds: Normal breath sounds. No wheezing or rales.  Genitourinary:    General: Normal vulva.     Vagina: Vaginal discharge (white, thin, scant) present.     Cervix: Normal.  Lymphadenopathy:     Cervical: No cervical adenopathy.  Skin:    General: Skin is warm and dry.  Neurological:     Mental Status: She is alert and oriented to person, place, and time.    ASSESSMENT and PLAN  1. Vaginal discharge - Cervicovaginal ancillary only  Return if symptoms worsen or fail to improve.    Rutherford Guys, MD [facility] at [place], [place] [address] Ph.  [phone] Fax [phone]

## 2019-05-12 NOTE — Patient Instructions (Addendum)
° ° ° °  If you have lab work done today you will be contacted with your lab results within the next 2 weeks.  If you have not heard from us then please contact us. The fastest way to get your results is to register for My Chart. ° ° °IF you received an x-ray today, you will receive an invoice from [facility]. Please contact [facility] at [phone] with questions or concerns regarding your invoice.  ° °IF you received labwork today, you will receive an invoice from [facility]. Please contact [facility] at [phone] with questions or concerns regarding your invoice.  ° °Our billing staff will not be able to assist you with questions regarding bills from these companies. ° °You will be contacted with the lab results as soon as they are available. The fastest way to get your results is to activate your My Chart account. Instructions are located on the last page of this paperwork. If you have not heard from us regarding the results in 2 weeks, please contact this office. °  ° ° ° °

## 2019-05-13 LAB — CERVICOVAGINAL ANCILLARY ONLY
Chlamydia: NEGATIVE
Neisseria Gonorrhea: NEGATIVE
Trichomonas: NEGATIVE

## 2019-05-21 ENCOUNTER — Encounter: Payer: Self-pay | Admitting: Family Medicine

## 2019-05-21 DIAGNOSIS — N898 Other specified noninflammatory disorders of vagina: Secondary | ICD-10-CM

## 2019-09-21 ENCOUNTER — Ambulatory Visit
Admission: EM | Admit: 2019-09-21 | Discharge: 2019-09-21 | Disposition: A | Discharge status: Home / Self Care | Payer: 59 | Attending: Emergency Medicine | Admitting: Emergency Medicine

## 2019-09-21 ENCOUNTER — Encounter: Payer: Self-pay | Admitting: Emergency Medicine

## 2019-09-21 ENCOUNTER — Other Ambulatory Visit: Payer: Self-pay

## 2019-09-21 DIAGNOSIS — H6123 Impacted cerumen, bilateral: Secondary | ICD-10-CM

## 2019-09-21 DIAGNOSIS — H6122 Impacted cerumen, left ear: Secondary | ICD-10-CM

## 2019-09-21 NOTE — Discharge Instructions (Addendum)
Return for worsening ear pain, swelling, discharge, bleeding, decreased hearing, development of jaw pain/swelling, fever.  Do NOT use Q-tips as these can cause your ear wax to get stuck, the tips may break off and become a foreign body requiring additional medical care, or puncture your eardrum.  Helpful prevention tip: Use a solution of equal parts isopropyl (rubbing) alcohol and white vinegar (acetic acid) in both ears after swimming. 

## 2019-09-21 NOTE — ED Provider Notes (Signed)
[facility]    CSN: [id number] Arrival date & time: 09/21/19  0941      History   Chief Complaint Chief Complaint  Patient presents with  . Hearing Loss    HPI Tami Robinson is a 23 y.o. female with history of nephrotic syndrome presenting for 1 week course of decreased hearing in left ear.  Patient states she tried Debrox given history of cerumen impaction which made decreased hearing worse.  Patient does endorse recent air travel on/25-states that she did have some pressure, though not a lot, and is adamant that she did not experience any pain or popping sensation.  Patient denies ear pain, trauma to the area, discharge blood, previous TM perforation, fever, jaw pain, throat pain.   Past Medical History:  Diagnosis Date  . Nephrotic syndrome     Patient Active Problem List   Diagnosis Date Noted  . Vaginal discharge 03/18/2019    History reviewed. No pertinent surgical history.  OB History   No obstetric history on file.      Home Medications    Prior to Admission medications   Not on File    Family History Family History  Problem Relation Age of Onset  . Healthy Mother   . Healthy Father     Social History Social History   Tobacco Use  . Smoking status: Never Smoker  . Smokeless tobacco: Never Used  Substance Use Topics  . Alcohol use: Yes    Alcohol/week: 2.0 standard drinks    Types: 2 Shots of liquor per week  . Drug use: No     Allergies   Patient has no known allergies.   Review of Systems Review of Systems  Constitutional: Negative for fatigue and fever.  HENT: Negative for congestion, dental problem, ear discharge, ear pain, facial swelling, hearing loss, sinus pain, sore throat, trouble swallowing and voice change.   Eyes: Negative for photophobia, pain and visual disturbance.  Respiratory: Negative for cough and shortness of breath.   Cardiovascular: Negative for chest pain and palpitations.  Gastrointestinal:  Negative for diarrhea and vomiting.  Musculoskeletal: Negative for arthralgias and myalgias.  Neurological: Negative for dizziness and headaches.     Physical Exam Triage Vital Signs ED Triage Vitals  Enc Vitals Group     BP 09/21/19 0947 115/69     Pulse Rate 09/21/19 0947 63     Resp 09/21/19 0947 16     Temp 09/21/19 0947 97.9 F (36.6 C)     Temp Source 09/21/19 0947 Oral     SpO2 09/21/19 0947 97 %     Weight --      Height --      Head Circumference --      Peak Flow --      Pain Score 09/21/19 0956 0     Pain Loc --      Pain Edu? --      Excl. in [place]? --    No data found.  Updated Vital Signs BP 115/69 (BP Location: Left Arm)   Pulse 63   Temp 97.9 F (36.6 C) (Oral)   Resp 16   LMP 09/07/2019   SpO2 97%   Visual Acuity Right Eye Distance:   Left Eye Distance:   Bilateral Distance:    Right Eye Near:   Left Eye Near:    Bilateral Near:     Physical Exam Constitutional:      General: She is not in acute distress.  Appearance: She is normal weight. She is not ill-appearing.  HENT:     Head: Normocephalic and atraumatic.     Right Ear: Ear canal and external ear normal. There is impacted cerumen.     Left Ear: Ear canal and external ear normal. There is impacted cerumen.     Ears:     Comments: Negative tragal tenderness bilaterally. A/P ear irrigation, left ear without tragal tenderness.  EAC without edema, erythema, or discharge.  TM intact without fluid/suppurativa.    Mouth/Throat:     Mouth: Mucous membranes are moist.     Pharynx: Oropharynx is clear.  Eyes:     General: No scleral icterus.    Pupils: Pupils are equal, round, and reactive to light.  Cardiovascular:     Rate and Rhythm: Normal rate.  Pulmonary:     Effort: Pulmonary effort is normal. No respiratory distress.     Breath sounds: No wheezing.  Musculoskeletal:     Cervical back: No tenderness.  Lymphadenopathy:     Cervical: No cervical adenopathy.  Skin:    Coloration:  Skin is not jaundiced or pale.  Neurological:     Mental Status: She is alert and oriented to person, place, and time.      UC Treatments / Results  Labs (all labs ordered are listed, but only abnormal results are displayed) Labs Reviewed - No data to display  EKG   Radiology No results found.  Procedures Procedures (including critical care time)  Medications Ordered in UC Medications - No data to display  Initial Impression / Assessment and Plan / UC Course  I have reviewed the triage vital signs and the nursing notes.  Pertinent labs & imaging results that were available during my care of the patient were reviewed by me and considered in my medical decision making (see chart for details).      Patient afebrile, nontoxic.  Exam significant for bilateral cerumen impaction: Irrigation of left ear attempted in office which patient tolerated well.  Left ear without sign of infection: We will continue supportive care, ear hygiene as outlined below.  Return precautions discussed, patient verbalized understanding and is agreeable to plan. Final Clinical Impressions(s) / UC Diagnoses   Final diagnoses:  Bilateral impacted cerumen  Hearing loss due to cerumen impaction, left     Discharge Instructions     Return for worsening ear pain, swelling, discharge, bleeding, decreased hearing, development of jaw pain/swelling, fever.  Do NOT use Q-tips as these can cause your ear wax to get stuck, the tips may break off and become a foreign body requiring additional medical care, or puncture your eardrum.  Helpful prevention tip: Use a solution of equal parts isopropyl (rubbing) alcohol and white vinegar (acetic acid) in both ears after swimming.    ED Prescriptions    None     PDMP not reviewed this encounter.   Hall[place], [place], [place] 09/21/19 1051

## 2019-09-21 NOTE — ED Triage Notes (Signed)
Pt presents to [facility] for assessment of 1 week of not being able to hear out of her left ear.  Patient states she tried debrox yesterday which seemed to make it worse.

## 2020-01-07 ENCOUNTER — Ambulatory Visit: Payer: 59 | Admitting: Adult Health Nurse Practitioner

## 2021-01-07 ENCOUNTER — Ambulatory Visit (HOSPITAL_COMMUNITY): Payer: Self-pay

## 2024-07-13 ENCOUNTER — Emergency Department (HOSPITAL_COMMUNITY)
Admission: EM | Admit: 2024-07-13 | Discharge: 2024-07-13 | Disposition: A | Discharge status: Home / Self Care | Payer: Self-pay | Attending: Emergency Medicine | Admitting: Emergency Medicine

## 2024-07-13 ENCOUNTER — Encounter (HOSPITAL_COMMUNITY): Payer: Self-pay

## 2024-07-13 ENCOUNTER — Other Ambulatory Visit: Payer: Self-pay

## 2024-07-13 DIAGNOSIS — R350 Frequency of micturition: Secondary | ICD-10-CM

## 2024-07-13 DIAGNOSIS — R1024 Suprapubic pain: Secondary | ICD-10-CM

## 2024-07-13 DIAGNOSIS — N760 Acute vaginitis: Secondary | ICD-10-CM | POA: Insufficient documentation

## 2024-07-13 DIAGNOSIS — B9689 Other specified bacterial agents as the cause of diseases classified elsewhere: Secondary | ICD-10-CM | POA: Insufficient documentation

## 2024-07-13 LAB — WET PREP, GENITAL
Sperm: NONE SEEN
Trich, Wet Prep: NONE SEEN
WBC, Wet Prep HPF POC: <10 (ref <10)
Yeast Wet Prep HPF POC: NONE SEEN

## 2024-07-13 LAB — COMPREHENSIVE METABOLIC PANEL WITH GFR
ALT: 9 U/L (ref 0–44)
AST: 16 U/L (ref 15–41)
Albumin: 3.8 g/dL (ref 3.5–5.0)
Alkaline Phosphatase: 53 U/L (ref 38–126)
Anion gap: 8 (ref 5–15)
BUN: 10 mg/dL (ref 6–20)
CO2: 22 mmol/L (ref 22–32)
Calcium: 9 mg/dL (ref 8.9–10.3)
Chloride: 105 mmol/L (ref 98–111)
Creatinine, Ser: 0.72 mg/dL (ref 0.44–1.00)
GFR, Estimated: >60 mL/min (ref >60)
Glucose, Bld: 86 mg/dL (ref 70–99)
Potassium: 3.9 mmol/L (ref 3.5–5.1)
Sodium: 135 mmol/L (ref 135–145)
Total Bilirubin: 0.7 mg/dL (ref 0.0–1.2)
Total Protein: 6.7 g/dL (ref 6.5–8.1)

## 2024-07-13 LAB — CBC
HCT: 39.6 % (ref 36.0–46.0)
Hemoglobin: 13.4 g/dL (ref 12.0–15.0)
MCH: 31.2 pg (ref 26.0–34.0)
MCHC: 33.8 g/dL (ref 30.0–36.0)
MCV: 92.1 fL (ref 80.0–100.0)
Platelets: 244 K/uL (ref 150–400)
RBC: 4.3 MIL/uL (ref 3.87–5.11)
RDW: 11.4 % — ABNORMAL LOW (ref 11.5–15.5)
WBC: 5.6 K/uL (ref 4.0–10.5)
nRBC: 0 % (ref 0.0–0.2)

## 2024-07-13 LAB — URINALYSIS, ROUTINE W REFLEX MICROSCOPIC
Bilirubin Urine: NEGATIVE
Glucose, UA: NEGATIVE mg/dL
Hgb urine dipstick: NEGATIVE
Ketones, ur: NEGATIVE mg/dL
Leukocytes,Ua: NEGATIVE
Nitrite: NEGATIVE
Protein, ur: NEGATIVE mg/dL
Specific Gravity, Urine: 1.025 (ref 1.005–1.030)
pH: 5 (ref 5.0–8.0)

## 2024-07-13 LAB — HCG, SERUM, QUALITATIVE: Preg, Serum: NEGATIVE

## 2024-07-13 LAB — LIPASE, BLOOD: Lipase: 43 U/L (ref 11–51)

## 2024-07-13 MED ORDER — METRONIDAZOLE 500 MG PO TABS: 500.0000 mg | ORAL_TABLET | Freq: Two times a day (BID) | ORAL | 0 refills | Status: AC

## 2024-07-13 MED ORDER — METRONIDAZOLE 500 MG PO TABS
500.0000 mg | ORAL_TABLET | Freq: Once | ORAL | Status: AC
  Administered 2024-07-13: 500 mg via ORAL
  Filled 2024-07-13: qty 1

## 2024-07-13 NOTE — ED Provider Triage Note (Signed)
 Emergency Medicine Provider Triage Evaluation Note  Tami Robinson , a 28 y.o. female  was evaluated in triage.  Pt complains of having increased urinary frequency for the past couple of months.  She reports that her urine is still the same color.  Reports some mild associated pain with this in her lower abdomen and sometimes in the left flank.  Denies any fever or chills.  Denies nausea, vomiting, diarrhea.  She does report a history of nephrotic syndrome, but states the symptoms do not seem consistent with previous nephrotic syndrome.  Review of Systems  Positive: As above Negative: As above  Physical Exam  BP 107/68   Pulse (!) 52   Temp 98.3 F (36.8 C)   Resp 18   SpO2 100%  Gen:   Awake, no distress   Resp:  Normal effort  MSK:   Moves extremities without difficulty    Medical Decision Making  Medically screening exam initiated at 12:57 PM.  Appropriate orders placed.  Tami Robinson was informed that the remainder of the evaluation will be completed by another provider, this initial triage assessment does not replace that evaluation, and the importance of remaining in the ED until their evaluation is complete.     Veta Palma, PA-C 07/13/24 1257

## 2024-07-13 NOTE — Discharge Instructions (Signed)
 Thank you for letting us  evaluate you today.  You do have tested positive for bacterial vaginosis which is not an STD but an overgrowth of bacteria in genital region.  This may be due to improper hygiene, douching, recent intercourse, or anything that throws off our normal vaginal pH.  I have given you medicine here in Emergency Department as well as a prescription for an antibiotic to treat this.  As discussed, STD testing will take 24 hours.  We will call you if you need to be treated.  I provided you with primary care provider recommendation.  Call them to make an appointment.  Also provided you with GYN recommendation.  Call them to make an appointment.  GYN may assist with urinary frequency with pelvic floor muscle

## 2024-07-13 NOTE — ED Provider Notes (Signed)
 [place] EMERGENCY DEPARTMENT AT [facility] Provider Note   CSN: [id number] Arrival date & time: 07/13/24  1110     Patient presents with: Abdominal Pain   Tami Robinson is a 28 y.o. female with past medical history of nephrotic syndrome presents to Emergency Department for evaluation of suprapubic abdominal pain, urinary urgency that has been going on for past 3-4 months but worsening over past month.  Reports that she feels urge to urinate 15-20 times a day with little output.  She has been drinking less water than she normally does to avoid urinating.  Pain typically occurs when she is holding urine but there is no pain with urination.  Pain at worst is 2-3/10. Is mainly bothered by increased urinary frequency. Denies concern for STD, vaginal symptoms, dysuria    Abdominal Pain      Prior to Admission medications   Medication Sig Start Date End Date Taking? Authorizing Provider  metroNIDAZOLE  (FLAGYL ) 500 MG tablet Take 1 tablet (500 mg total) by mouth 2 (two) times daily for 7 days. 07/13/24 07/20/24 Yes Minnie Tinnie BRAVO, PA    Allergies: Patient has no known allergies.    Review of Systems  Gastrointestinal:  Positive for abdominal pain.    Updated Vital Signs BP (!) 106/58   Pulse (!) 58   Temp 98.1 F (36.7 C) (Oral)   Resp 16   SpO2 100%   Physical Exam Vitals and nursing note reviewed.  Constitutional:      General: She is not in acute distress.    Appearance: Normal appearance.  HENT:     Head: Normocephalic and atraumatic.  Eyes:     Conjunctiva/sclera: Conjunctivae normal.  Cardiovascular:     Rate and Rhythm: Normal rate.  Pulmonary:     Effort: Pulmonary effort is normal. No respiratory distress.  Abdominal:     General: Bowel sounds are normal.     Palpations: Abdomen is soft.     Tenderness: There is abdominal tenderness in the suprapubic area. There is no right CVA tenderness, left CVA tenderness, guarding or rebound.     Comments:  Very mild suprapubic tenderness with deep palpation. Non surgical abdomen with no peritoneal signs  Genitourinary:    Cervix: Normal. No cervical motion tenderness or friability.     Adnexa:        Right: No mass.         Left: No mass.       Comments: Mildly thick white discharge. No CMT. No friability to cervix Skin:    Coloration: Skin is not jaundiced or pale.  Neurological:     Mental Status: She is alert. Mental status is at baseline.   GU exam chaperoned by Lorane Right RN  (all labs ordered are listed, but only abnormal results are displayed) Labs Reviewed  WET PREP, GENITAL - Abnormal; Notable for the following components:      Result Value   Clue Cells Wet Prep HPF POC PRESENT (*)    All other components within normal limits  CBC - Abnormal; Notable for the following components:   RDW 11.4 (*)    All other components within normal limits  LIPASE, BLOOD  COMPREHENSIVE METABOLIC PANEL WITH GFR  URINALYSIS, ROUTINE W REFLEX MICROSCOPIC  HCG, SERUM, QUALITATIVE  GC/CHLAMYDIA PROBE AMP ([place]) NOT AT [facility]    EKG: None  Radiology: No results found.   Medications Ordered in the ED  metroNIDAZOLE  (FLAGYL ) tablet 500 mg (500 mg Oral  Given 07/13/24 1823)                                   Medical Decision Making Amount and/or Complexity of Data Reviewed Labs: ordered.  Risk Prescription drug management.   Patient presents to the ED for concern of suprapubic pain, urinary urgency, this involves an extensive number of treatment options, and is a complaint that carries with it a high risk of complications and morbidity.  The differential diagnosis includes PID, UTI, stone, pregnancy, IC, hyperglycemia   Co morbidities that complicate the patient evaluation  none   Additional history obtained:  Additional history obtained from Nursing   External records from outside source obtained and reviewed including triage RN note   Lab Tests:  I Ordered,  and personally interpreted labs.  The pertinent results include:   Wet prep + for BV    Medicines ordered and prescription drug management:  I ordered medication including metro  for BV  Reevaluation of the patient after these medicines showed that the patient stayed the same I have reviewed the patients home medicines and have made adjustments as needed    Problem List / ED Course:  Suprapubic pain VS wo fever nor tachycardia Very mild tenderness only with deep palpation No leukocytosis Hcg negative No hyperglycemia Do not think imaging would be beneficial with how mildly tender patient is. Also has had mild pain and frequency for past 3-50mo with labs WNL that likely would have be abnormal if acute pathology. Low suspicion for acute abdomen with no peritoneal signs No unilateral pain nor vomiting. Low suspicion for torsion Pelvic exam without CMT tenderness.  Thick white discharge. Reports that she did have STD screening at clinic that was negative this week however I am unable to see it so we will repeated to ensure no STD as etiology.  No CMT so have low suspicion for PID  Urinary frequency UA without infection nor Hgb No leukocytosis No back pain.  No history of stones.  Low suspicion for stone with no Hgb and urine Question IC, OAB. Will have her follow up with PCP, GYN for pelvic floor exercises, further management Discussed avoiding triggers for frequency, coffee, etc  BV Provided 1 dose of metronidazole  Emergency Department as well as prescription Discussed antibiotic stewardship Discussed symptomatic treatment and recommendations to avoid BV in future   Reevaluation:  After the interventions noted above, I reevaluated the patient and found that they have :stayed the same    Dispostion:  After consideration of the diagnostic results and the patients response to treatment, I feel that the patent would benefit from outpatient management with PCP and GYN follow up.    Discussed ED workup, disposition, return to ED precautions with patient who expresses understanding agrees with plan.  All questions answered to their satisfaction.  They are agreeable to plan.  Discharge instructions provided on paperwork  Discussed patient with Dr. Emil who reviewed ED workup and agrees with plan  Final diagnoses:  Suprapubic pain  Urinary frequency  BV (bacterial vaginosis)    ED Discharge Orders          Ordered    metroNIDAZOLE  (FLAGYL ) 500 MG tablet  2 times daily        07/13/24 1806             Minnie Tinnie BRAVO, PA 07/13/24 1924    Emil Share, DO 07/13/24 2004

## 2024-07-13 NOTE — ED Triage Notes (Signed)
 PT arrives via POV. PT reports intermittent lower abdominal pain, left flank pain, and increased urinary frequency for over a month. Pt reports symptoms have been getting worse. Denies n/v/d.

## 2024-07-13 NOTE — ED Notes (Signed)
 Chaperoned pelvic exam with Minnie PA

## 2024-07-14 LAB — GC/CHLAMYDIA PROBE AMP (~~LOC~~) NOT AT ARMC
Chlamydia: NEGATIVE
Comment: NEGATIVE
Comment: NORMAL
Neisseria Gonorrhea: NEGATIVE
